# Patient Record
Sex: Female | Born: 1980 | Race: White | Hispanic: No | Smoking: Current every day smoker
Health system: Southern US, Community
[De-identification: ages and names within clinical notes are randomized; demographics above are authoritative.]

## PROBLEM LIST (undated history)

## (undated) DIAGNOSIS — G4730 Sleep apnea, unspecified: Secondary | ICD-10-CM

## (undated) DIAGNOSIS — F419 Anxiety disorder, unspecified: Secondary | ICD-10-CM

## (undated) DIAGNOSIS — M1990 Unspecified osteoarthritis, unspecified site: Secondary | ICD-10-CM

## (undated) HISTORY — DX: Anxiety disorder, unspecified: F41.9

## (undated) HISTORY — DX: Unspecified osteoarthritis, unspecified site: M19.90

## (undated) HISTORY — DX: Sleep apnea, unspecified: G47.30

---

## 2011-12-08 ENCOUNTER — Other Ambulatory Visit: Payer: Self-pay | Admitting: Psychiatry

## 2011-12-08 DIAGNOSIS — M961 Postlaminectomy syndrome, not elsewhere classified: Secondary | ICD-10-CM

## 2011-12-13 ENCOUNTER — Other Ambulatory Visit: Payer: Self-pay

## 2011-12-14 ENCOUNTER — Ambulatory Visit
Admission: RE | Admit: 2011-12-14 | Discharge: 2011-12-14 | Disposition: A | Discharge status: Home / Self Care | Payer: Managed Care, Other (non HMO) | Source: Ambulatory Visit | Attending: Psychiatry | Admitting: Psychiatry

## 2011-12-14 DIAGNOSIS — M961 Postlaminectomy syndrome, not elsewhere classified: Secondary | ICD-10-CM

## 2011-12-14 MED ORDER — GADOBENATE DIMEGLUMINE 529 MG/ML IV SOLN
15.0000 mL | Freq: Once | INTRAVENOUS | Status: AC | PRN
  Administered 2011-12-14: 15 mL via INTRAVENOUS

## 2011-12-25 ENCOUNTER — Other Ambulatory Visit: Payer: Self-pay | Admitting: Psychiatry

## 2011-12-25 ENCOUNTER — Ambulatory Visit
Admission: RE | Admit: 2011-12-25 | Discharge: 2011-12-25 | Disposition: A | Discharge status: Home / Self Care | Payer: Managed Care, Other (non HMO) | Source: Ambulatory Visit | Attending: Psychiatry | Admitting: Psychiatry

## 2011-12-25 DIAGNOSIS — IMO0001 Reserved for inherently not codable concepts without codable children: Secondary | ICD-10-CM

## 2011-12-25 DIAGNOSIS — M961 Postlaminectomy syndrome, not elsewhere classified: Secondary | ICD-10-CM

## 2012-11-27 IMAGING — CR DG CERVICAL SPINE WITH FLEX & EXTEND
7 series · 7 of 7 positions shown · non-contrast
Comparison: MR cervical spine of 12/14/2011

CLINICAL DATA: Neck pain

CERVICAL SPINE COMPLETE WITH FLEXION AND EXTENSION VIEWS

[view not recorded (1 of 7)]
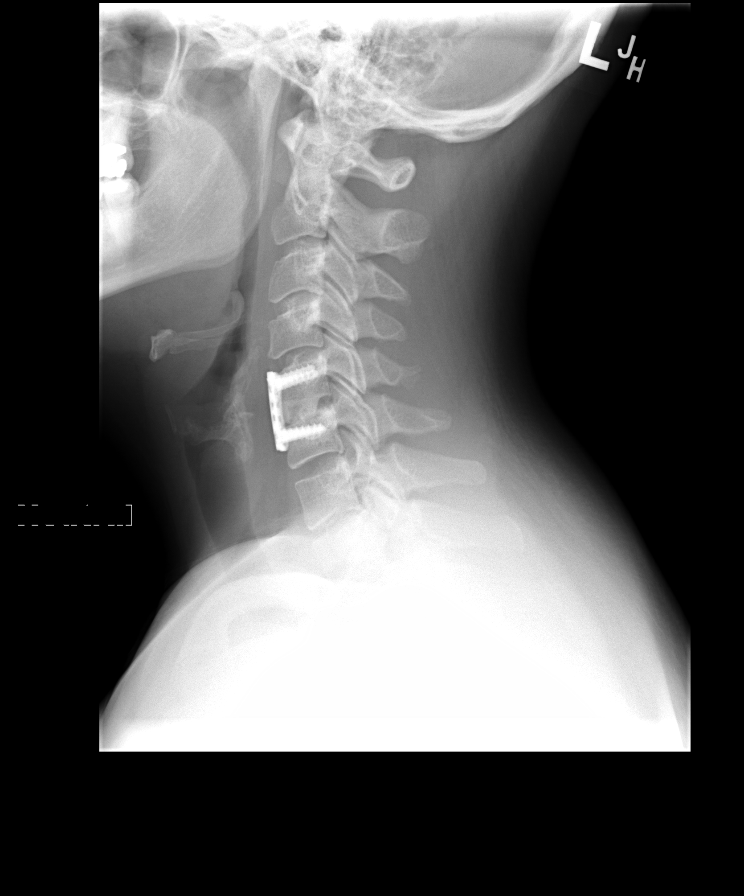

[view not recorded (2 of 7)]
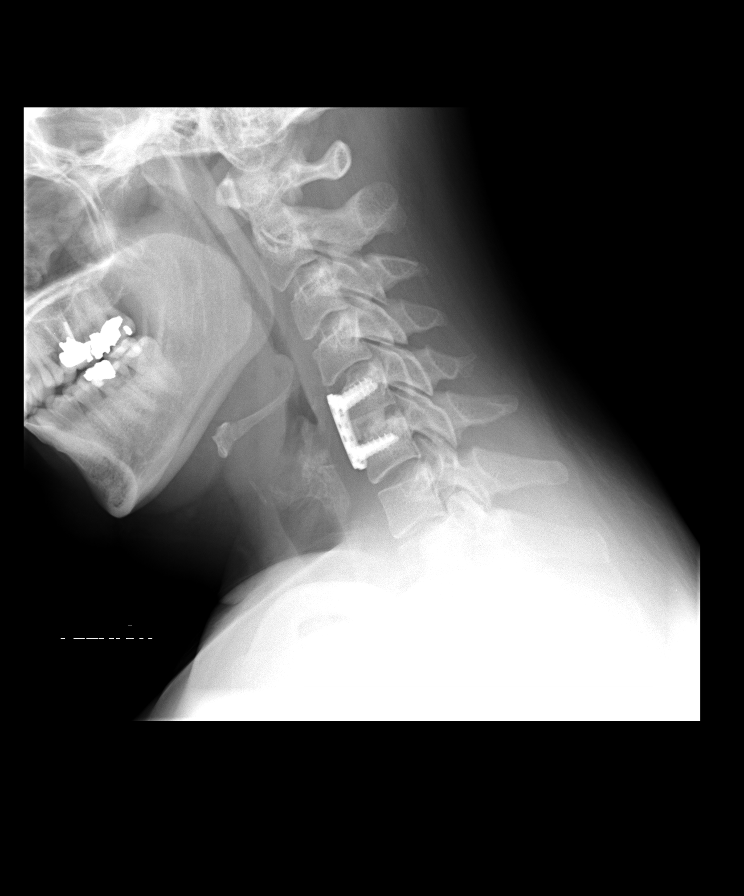

[view not recorded (3 of 7)]
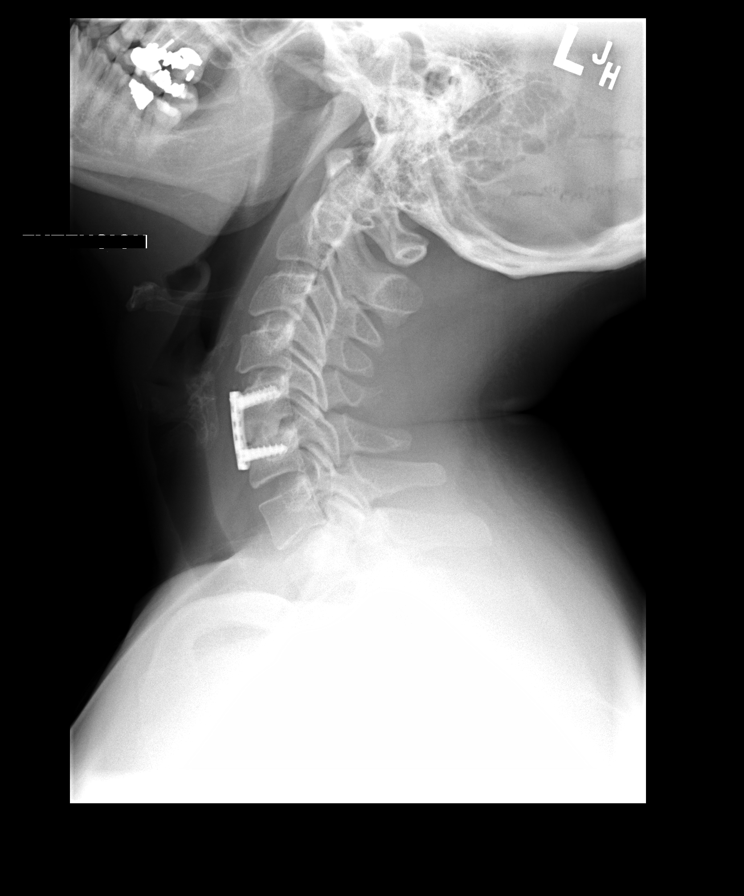

[view not recorded (4 of 7)]
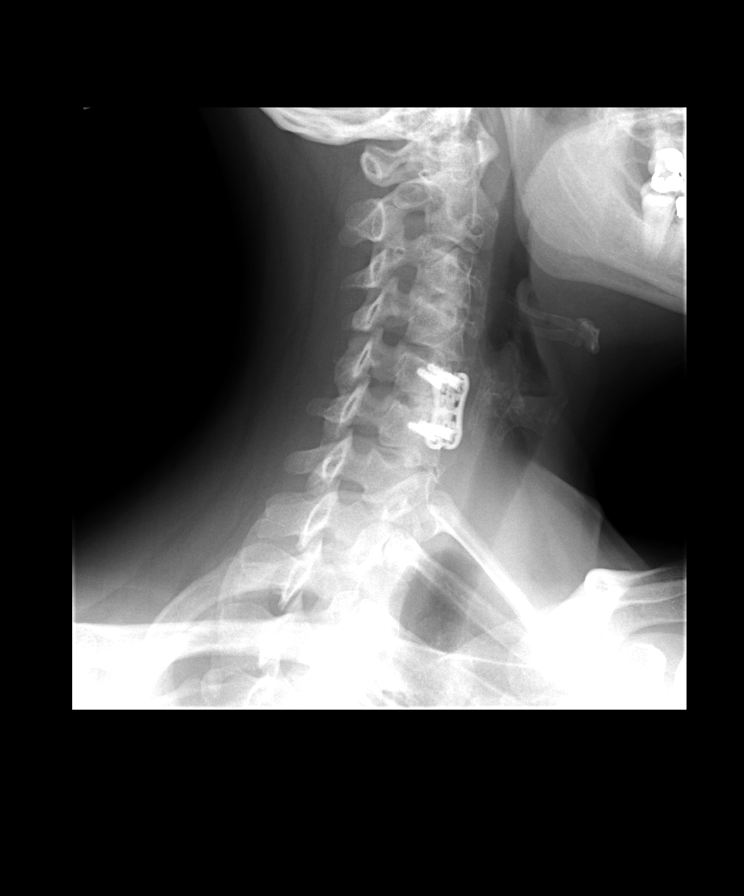

[view not recorded (5 of 7)]
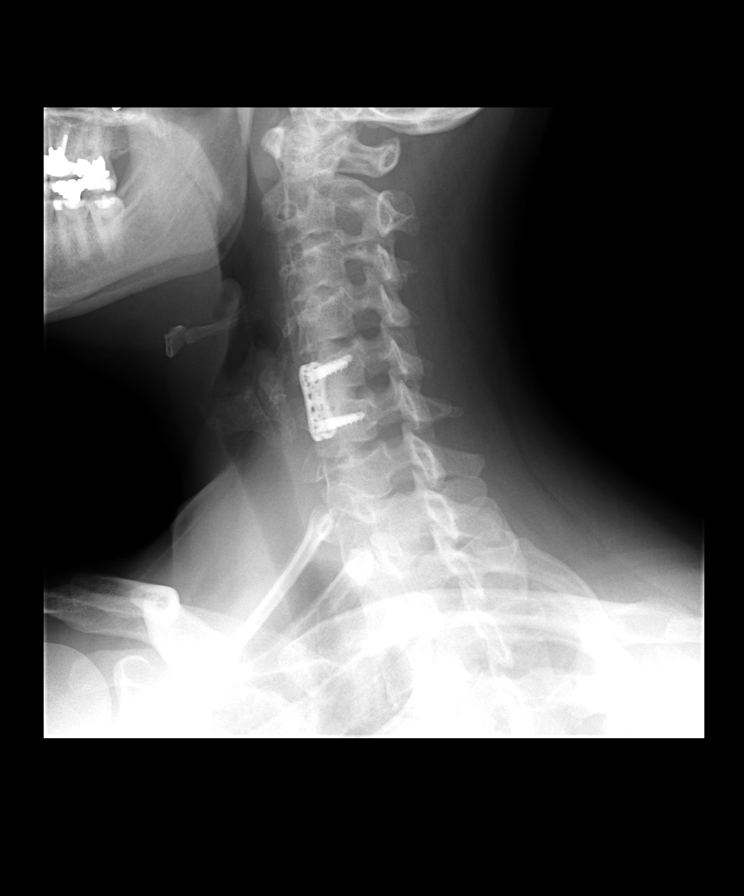

[view not recorded (6 of 7)]
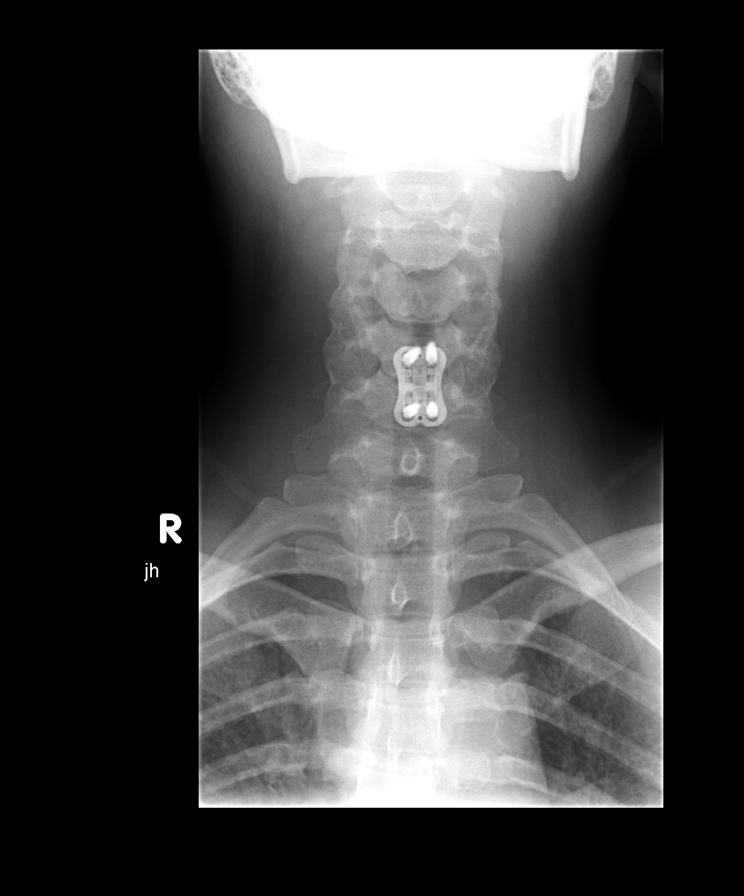

[view not recorded (7 of 7)]
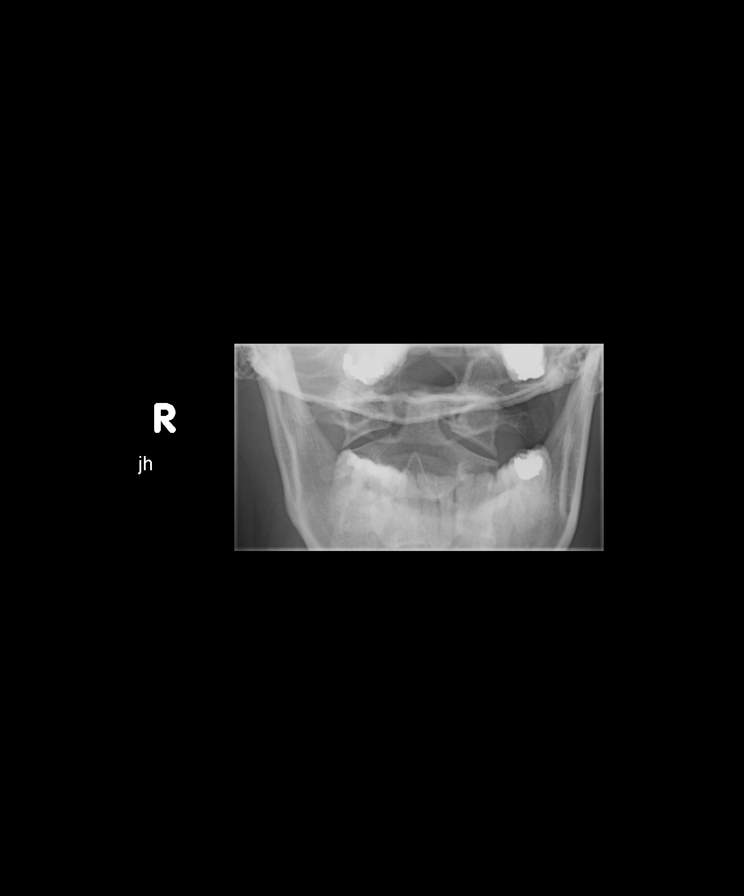

[7 of 7 positions shown; findings below may reference images not displayed]

FINDINGS: The cervical vertebrae are in normal alignment.  Anterior
fusion appears solid at the C5-6 level.  The remainder of
intervertebral disc spaces are within normal limits.  No
prevertebral soft tissue swelling is seen.  On oblique views the
foramina are patent.  The odontoid process is intact.  The lung
apices are clear.

Through flexion and extension there is relatively normal range of
motion with no malalignment.
IMPRESSION: 1.  Normal alignment.  Solid fusion at C5-6.
2.  Normal range of motion through flexion and extension.

## 2013-07-17 DIAGNOSIS — M797 Fibromyalgia: Secondary | ICD-10-CM | POA: Insufficient documentation

## 2014-01-15 DIAGNOSIS — M5481 Occipital neuralgia: Secondary | ICD-10-CM | POA: Insufficient documentation

## 2017-08-10 DIAGNOSIS — G894 Chronic pain syndrome: Secondary | ICD-10-CM | POA: Insufficient documentation

## 2019-03-24 HISTORY — PX: OTHER SURGICAL HISTORY: SHX169

## 2020-05-20 LAB — HM PAP SMEAR

## 2020-05-20 LAB — RESULTS CONSOLE HPV: CHL HPV: NEGATIVE

## 2020-05-21 LAB — HM MAMMOGRAPHY

## 2021-04-15 DIAGNOSIS — Z981 Arthrodesis status: Secondary | ICD-10-CM | POA: Diagnosis not present

## 2021-04-15 DIAGNOSIS — M7918 Myalgia, other site: Secondary | ICD-10-CM | POA: Diagnosis not present

## 2021-04-15 DIAGNOSIS — M4322 Fusion of spine, cervical region: Secondary | ICD-10-CM | POA: Diagnosis not present

## 2021-04-15 DIAGNOSIS — M961 Postlaminectomy syndrome, not elsewhere classified: Secondary | ICD-10-CM | POA: Diagnosis not present

## 2021-04-15 DIAGNOSIS — G894 Chronic pain syndrome: Secondary | ICD-10-CM | POA: Diagnosis not present

## 2021-04-15 DIAGNOSIS — M47812 Spondylosis without myelopathy or radiculopathy, cervical region: Secondary | ICD-10-CM | POA: Diagnosis not present

## 2021-04-15 DIAGNOSIS — Z79899 Other long term (current) drug therapy: Secondary | ICD-10-CM | POA: Diagnosis not present

## 2021-04-15 DIAGNOSIS — Z9889 Other specified postprocedural states: Secondary | ICD-10-CM | POA: Diagnosis not present

## 2021-07-15 DIAGNOSIS — M961 Postlaminectomy syndrome, not elsewhere classified: Secondary | ICD-10-CM | POA: Diagnosis not present

## 2021-07-15 DIAGNOSIS — M7918 Myalgia, other site: Secondary | ICD-10-CM | POA: Diagnosis not present

## 2021-07-15 DIAGNOSIS — G894 Chronic pain syndrome: Secondary | ICD-10-CM | POA: Diagnosis not present

## 2021-10-06 DIAGNOSIS — G894 Chronic pain syndrome: Secondary | ICD-10-CM | POA: Diagnosis not present

## 2021-10-06 DIAGNOSIS — Z79899 Other long term (current) drug therapy: Secondary | ICD-10-CM | POA: Diagnosis not present

## 2021-10-06 DIAGNOSIS — M961 Postlaminectomy syndrome, not elsewhere classified: Secondary | ICD-10-CM | POA: Diagnosis not present

## 2021-10-31 DIAGNOSIS — R059 Cough, unspecified: Secondary | ICD-10-CM | POA: Diagnosis not present

## 2021-10-31 DIAGNOSIS — B9689 Other specified bacterial agents as the cause of diseases classified elsewhere: Secondary | ICD-10-CM | POA: Diagnosis not present

## 2021-10-31 DIAGNOSIS — J208 Acute bronchitis due to other specified organisms: Secondary | ICD-10-CM | POA: Diagnosis not present

## 2021-10-31 DIAGNOSIS — R062 Wheezing: Secondary | ICD-10-CM | POA: Diagnosis not present

## 2022-01-05 DIAGNOSIS — G894 Chronic pain syndrome: Secondary | ICD-10-CM | POA: Diagnosis not present

## 2022-01-05 DIAGNOSIS — M961 Postlaminectomy syndrome, not elsewhere classified: Secondary | ICD-10-CM | POA: Diagnosis not present

## 2022-01-05 DIAGNOSIS — M47816 Spondylosis without myelopathy or radiculopathy, lumbar region: Secondary | ICD-10-CM | POA: Diagnosis not present

## 2022-02-09 ENCOUNTER — Encounter: Payer: Self-pay | Admitting: Family Medicine

## 2022-02-09 ENCOUNTER — Ambulatory Visit: Payer: BC Managed Care – PPO | Admitting: Family Medicine

## 2022-02-09 VITALS — BP 108/70 | HR 76 | Temp 98.3°F | Resp 18 | Ht 64.0 in | Wt 171.4 lb

## 2022-02-09 DIAGNOSIS — E782 Mixed hyperlipidemia: Secondary | ICD-10-CM | POA: Diagnosis not present

## 2022-02-09 DIAGNOSIS — R5383 Other fatigue: Secondary | ICD-10-CM | POA: Diagnosis not present

## 2022-02-09 DIAGNOSIS — G894 Chronic pain syndrome: Secondary | ICD-10-CM

## 2022-02-09 DIAGNOSIS — Z7689 Persons encountering health services in other specified circumstances: Secondary | ICD-10-CM

## 2022-02-09 LAB — COMPREHENSIVE METABOLIC PANEL
ALT: 13 U/L (ref 0–35)
AST: 15 U/L (ref 0–37)
Albumin: 4.6 g/dL (ref 3.5–5.2)
Alkaline Phosphatase: 41 U/L (ref 39–117)
BUN: 11 mg/dL (ref 6–23)
CO2: 28 mEq/L (ref 19–32)
Calcium: 9.4 mg/dL (ref 8.4–10.5)
Chloride: 103 mEq/L (ref 96–112)
Creatinine, Ser: 0.73 mg/dL (ref 0.40–1.20)
GFR: 101.36 mL/min (ref >60.00)
Glucose, Bld: 86 mg/dL (ref 70–99)
Potassium: 4.3 mEq/L (ref 3.5–5.1)
Sodium: 138 mEq/L (ref 135–145)
Total Bilirubin: 0.5 mg/dL (ref 0.2–1.2)
Total Protein: 7.1 g/dL (ref 6.0–8.3)

## 2022-02-09 LAB — LIPID PANEL
Cholesterol: 240 mg/dL — ABNORMAL HIGH (ref 0–200)
HDL: 47.5 mg/dL (ref >39.00)
LDL Cholesterol: 174 mg/dL — ABNORMAL HIGH (ref 0–99)
NonHDL: 192.27
Total CHOL/HDL Ratio: 5
Triglycerides: 90 mg/dL (ref 0.0–149.0)
VLDL: 18 mg/dL (ref 0.0–40.0)

## 2022-02-09 LAB — CBC
HCT: 40.6 % (ref 36.0–46.0)
Hemoglobin: 14.3 g/dL (ref 12.0–15.0)
MCHC: 35.1 g/dL (ref 30.0–36.0)
MCV: 85.2 fl (ref 78.0–100.0)
Platelets: 197 10*3/uL (ref 150.0–400.0)
RBC: 4.77 Mil/uL (ref 3.87–5.11)
RDW: 12.6 % (ref 11.5–15.5)
WBC: 9.4 10*3/uL (ref 4.0–10.5)

## 2022-02-09 LAB — VITAMIN B12: Vitamin B-12: 263 pg/mL (ref 211–911)

## 2022-02-09 LAB — TSH: TSH: 0.89 u[IU]/mL (ref 0.35–5.50)

## 2022-02-09 NOTE — Assessment & Plan Note (Signed)
At baseline Medication management by [facility]) Consider PT referral for soft tissue modalities and dry needling - she will think about it and let me know

## 2022-02-09 NOTE — Assessment & Plan Note (Signed)
Elevated on previous labs, repeat today Lifestyle factors for lowering cholesterol include: Diet therapy - heart-healthy diet rich in fruits, veggies, fiber-rich whole grains, lean meats, chicken, fish (at least twice a week), fat-free or 1% dairy products; foods low in saturated/trans fats, cholesterol, sodium, and sugar. Mediterranean diet has shown to be very heart healthy. Regular exercise - recommend at least 30 minutes a day, 5 times per week Weight management

## 2022-02-09 NOTE — Progress Notes (Signed)
New Patient Office Visit  Subjective    Patient ID: Tanya Gallegos, female    DOB: 06/12/1980  Age: 41 y.o. MRN: [id number]  CC:  Chief Complaint  Patient presents with   New Patient (Initial Visit)    HPI Tanya Gallegos presents to establish care  For the past year or so she has noticed some new food sensitivies and digestive issues. Reports she feels more gassy and bloated than usual, but no abdominal pain (unless she tries to prevent herself from passing gas for too long), no stool changes, no nausea, vomiting, diarrhea, constipation, blood in stool. She thinks chicken may be a trigger, but otherwise unsure.   She also reports always feeling tired/fatigued. She feels like she could take a nap everyday. Her daughter reports that she snores loudly and has noticed some periods of apnea during her sleep. She has never had a sleep study before, but is interested.   She has a history of back/neck surgeries leading to a lot of restrictions. She is stable as long as she is on chronic pain meds and does not overdo it with activity. She gets good relief from massage therapy, but it becomes expensive. She has done PT in the past and has a pool at home for aqua therapy. She follows with [facility] pain management clinic every 3 months.   She follows with GYN for routine care. Pap and mammo were normal last year (records requested). Mirena IUD for contraception. Not currently sexually active.     Outpatient Encounter Medications as of 02/09/2022  Medication Sig   buprenorphine (SUBUTEX) 2 MG SUBL SL tablet Place 2 mg under the tongue daily.   HYDROcodone-acetaminophen (NORCO) 10-325 MG tablet Take 1 tablet by mouth every 6 (six) hours as needed.   levonorgestrel (MIRENA) 20 MCG/DAY IUD by Intrauterine route.   pregabalin (LYRICA) 150 MG capsule Take 1 capsule by mouth 2 (two) times daily.   zolpidem (AMBIEN) 10 MG tablet Take 10 mg by mouth at bedtime as needed for sleep.   No  facility-administered encounter medications on file as of 02/09/2022.    Past Medical History:  Diagnosis Date   Anxiety    Arthritis    Sleep apnea     Past Surgical History:  Procedure Laterality Date   neck fusion  2021    History reviewed. No pertinent family history.  Social History   Socioeconomic History   Marital status: Divorced    Spouse name: Not on file   Number of children: Not on file   Years of education: Not on file   Highest education level: Not on file  Occupational History   Not on file  Tobacco Use   Smoking status: Every Day    Types: Cigarettes   Smokeless tobacco: Never  Substance and Sexual Activity   Alcohol use: Yes   Drug use: Never   Sexual activity: Not Currently  Other Topics Concern   Not on file  Social History Narrative   Not on file   Social Determinants of Health   Financial Resource Strain: Not on file  Food Insecurity: Not on file  Transportation Needs: Not on file  Physical Activity: Not on file  Stress: Not on file  Social Connections: Not on file  Intimate Partner Violence: Not on file    ROS All review of systems negative except what is listed in the HPI      Objective    BP 108/70 (BP Location: Left Arm, Patient Position:  Sitting, Cuff Size: Normal)   Pulse 76   Temp 98.3 F (36.8 C) (Oral)   Resp 18   Ht 5\' 4"  (1.626 m)   Wt 171 lb 6.4 oz (77.7 kg)   SpO2 96%   BMI 29.42 kg/m   Physical Exam Vitals reviewed.  Constitutional:      General: She is not in acute distress.    Appearance: Normal appearance. She is not ill-appearing.  Cardiovascular:     Rate and Rhythm: Normal rate and regular rhythm.  Pulmonary:     Effort: Pulmonary effort is normal.     Breath sounds: Normal breath sounds.  Musculoskeletal:     Cervical back: Normal range of motion and neck supple. No tenderness.  Lymphadenopathy:     Cervical: No cervical adenopathy.  Skin:    General: Skin is warm and dry.  Neurological:      General: No focal deficit present.     Mental Status: She is alert and oriented to person, place, and time. Mental status is at baseline.  Psychiatric:        Mood and Affect: Mood normal.        Behavior: Behavior normal.        Thought Content: Thought content normal.        Judgment: Judgment normal.         Assessment & Plan:   Problem List Items Addressed This Visit       Other   Chronic pain syndrome    At baseline Medication management by [facility]) Consider PT referral for soft tissue modalities and dry needling - she will think about it and let me know      Relevant Medications   buprenorphine (SUBUTEX) 2 MG SUBL SL tablet   pregabalin (LYRICA) 150 MG capsule   HYDROcodone-acetaminophen (NORCO) 10-325 MG tablet   Mixed hyperlipidemia    Elevated on previous labs, repeat today Lifestyle factors for lowering cholesterol include: Diet therapy - heart-healthy diet rich in fruits, veggies, fiber-rich whole grains, lean meats, chicken, fish (at least twice a week), fat-free or 1% dairy products; foods low in saturated/trans fats, cholesterol, sodium, and sugar. Mediterranean diet has shown to be very heart healthy. Regular exercise - recommend at least 30 minutes a day, 5 times per week Weight management        Relevant Orders   Comprehensive metabolic panel   Lipid panel   Other Visit Diagnoses     Fatigue, unspecified type    -  Primary Labs today Symptoms concerning for OSA - refer to pulmonology for potential home sleep study     Relevant Orders   CBC   Comprehensive metabolic panel   TSH   Ambulatory referral to Pulmonology   B12   Encounter to establish care           Return in about 1 year (around 02/10/2023) for CPE; f/u pending labs .   02/12/2023, NP

## 2022-02-09 NOTE — Patient Instructions (Addendum)
Thank you for choosing [facility] for your Primary Care needs. I am excited for the opportunity to partner with you to meet your health care goals. It was a pleasure meeting you today!   Recommend starting a food diary and monitoring intake and symptoms to find other potential triggers. Avoid possible triggers. If not improving with avoidance, we can refer to GI.   Information on diet, exercise, and health maintenance recommendations are listed below. This is information to help you be sure you are on track for optimal health and monitoring.   Please look over this and let us know if you have any questions or if you have completed any of the health maintenance outside of [facility] so that we can be sure your records are up to date.  ___________________________________________________________  MyChart:  For all urgent or time sensitive needs we ask that you please call the office to avoid delays. Our number is [phone]. MyChart is not constantly monitored and due to the large volume of messages a day, replies may take up to 72 business hours.  MyChart Policy: MyChart allows for you to see your visit notes, after visit summary, provider recommendations, lab and tests results, make an appointment, request refills, and contact your provider or the office for non-urgent questions or concerns. Providers are seeing patients during normal business hours and do not have built in time to review MyChart messages.  We ask that you allow a minimum of 3 business days for responses to [employer]. For this reason, please do not send urgent requests through MyChart. Please call the office at [phone]. New and ongoing conditions may require a visit. We have virtual and in-person visits available for your convenience.  Complex MyChart concerns may require a visit. Your provider may request you schedule a virtual or in-person visit to ensure we are providing the best  care possible. MyChart messages sent after 11:00 AM on Friday will not be received by the provider until Monday morning.    Lab and Test Results: You will receive your lab and test results on MyChart as soon as they are completed and results have been sent by the lab or testing facility. Due to this service, you will receive your results BEFORE your provider.  I review lab and test results each morning prior to seeing patients. Some results require collaboration with other providers to ensure you are receiving the most appropriate care. For this reason, we ask that you please allow a minimum of 3-5 business days from the time that ALL results have been received for your provider to receive and review lab and test results and contact you about these.  Most lab and test result comments from the provider will be sent through MyChart. Your provider may recommend changes to the plan of care, follow-up visits, repeat testing, ask questions, or request an office visit to discuss these results. You may reply directly to this message or call the office to provide information for the provider or set up an appointment. In some instances, you will be called with test results and recommendations. Please let us know if this is preferred and we will make note of this in your chart to provide this for you.    If you have not heard a response to your lab or test results in 5 business days from all results returning to MyChart, please call the office to let us know. We ask that you please avoid calling  prior to this time unless there is an emergent concern. Due to high call volumes, this can delay the resulting process.  After Hours: For all non-emergency after hours needs, please call the office at [phone] and select the option to reach the on-call  service. On-call services are shared between multiple [facility] offices and therefore it will not be possible to speak directly with your provider. On-call providers  may provide medical advice and recommendations, but are unable to provide refills for maintenance medications.  For all emergency or urgent medical needs after normal business hours, we recommend that you seek care at the closest Urgent Care or Emergency Department to ensure appropriate treatment in a timely manner.  [facility] has a 24 hour emergency room located on the ground floor for your convenience.   Urgent Concerns During the Business Day Providers are seeing patients from 8AM to 5PM with a busy schedule and are most often not able to respond to non-urgent calls until the end of the day or the next business day. If you should have URGENT concerns during the day, please call and speak to the nurse or schedule a same day appointment so that we can address your concern without delay.   Thank you, again, for choosing me as your health care partner. I appreciate your trust and look forward to learning more about you.   Lollie Marrowaylor B. Reola CalkinsBeck, DNP, FNP-C  ___________________________________________________________  Health Maintenance Recommendations Screening Testing Mammogram Every 1-2 years based on history and risk factors Starting at age 42 Pap Smear Ages 21-39 every 3 years Ages 4130-65 every 5 years with HPV testing More frequent testing may be required based on results and history Colon Cancer Screening Every 1-10 years based on test performed, risk factors, and history Starting at age 42 Bone Density Screening Every 2-10 years based on history Starting at age 42 for women Recommendations for men differ based on medication usage, history, and risk factors AAA Screening One time ultrasound Men 9765-42 years old who have ever smoked Lung Cancer Screening Low Dose Lung CT every 12 months Age 60-80 years with a 20 pack-year smoking history who still smoke or who have quit within the last 15 years  Screening Labs Routine  Labs: Complete Blood Count (CBC),  Complete Metabolic Panel (CMP), Cholesterol (Lipid Panel) Every 6-12 months based on history and medications May be recommended more frequently based on current conditions or previous results Hemoglobin A1c Lab Every 3-12 months based on history and previous results Starting at age 42 or earlier with diagnosis of diabetes, high cholesterol, BMI >26, and/or risk factors Frequent monitoring for patients with diabetes to ensure blood sugar control Thyroid Panel (TSH w/ T3 & T4) Every 6 months based on history, symptoms, and risk factors May be repeated more often if on medication HIV One time testing for all patients 3913 and older May be repeated more frequently for patients with increased risk factors or exposure Hepatitis C One time testing for all patients 1918 and older May be repeated more frequently for patients with increased risk factors or exposure Gonorrhea, Chlamydia Every 12 months for all sexually active persons 13-24 years Additional monitoring may be recommended for those who are considered high risk or who have symptoms PSA Men 4640-679 years old with risk factors Additional screening may be recommended from age 42-69 based on risk factors, symptoms, and history  Vaccine Recommendations Tetanus Booster All adults every 10 years Flu Vaccine All patients 6 months and older  every year COVID Vaccine All patients 12 years and older Initial dosing with booster May recommend additional booster based on age and health history HPV Vaccine 2 doses all patients age 87-26 Dosing may be considered for patients over 26 Shingles Vaccine (Shingrix) 2 doses all adults 50 years and older Pneumonia (Pneumovax 23) All adults 65 years and older May recommend earlier dosing based on health history Pneumonia (Prevnar 39) All adults 65 years and older Dosed 1 year after Pneumovax 23 Pneumonia (Prevnar 20) All adults 65 years and older (adults 19-64 with certain conditions or risk  factors) 1 dose  For those who have no received Prevnar 13 vaccine previously   Additional Screening, Testing, and Vaccinations may be recommended on an individualized basis based on family history, health history, risk factors, and/or exposure.  __________________________________________________________  Diet Recommendations for All Patients  I recommend that all patients maintain a diet low in saturated fats, carbohydrates, and cholesterol. While this can be challenging at first, it is not impossible and small changes can make big differences.  Things to try: Decreasing the amount of soda, sweet tea, and/or juice to one or less per day and replace with water While water is always the first choice, if you do not like water you may consider adding a water additive without sugar to improve the taste other sugar free drinks Replace potatoes with a brightly colored vegetable at dinner Use healthy oils, such as canola oil or olive oil, instead of butter or hard margarine Limit your bread intake to two pieces or less a day Replace regular pasta with low carb pasta options Bake, broil, or grill foods instead of frying Monitor portion sizes  Eat smaller, more frequent meals throughout the day instead of large meals  An important thing to remember is, if you love foods that are not great for your health, you don't have to give them up completely. Instead, allow these foods to be a reward when you have done well. Allowing yourself to still have special treats every once in a while is a nice way to tell yourself thank you for working hard to keep yourself healthy.   Also remember that every day is a new day. If you have a bad day and "fall off the wagon", you can still climb right back up and keep moving along on your journey!  We have resources available to help you!  Some websites that may be helpful  include: www.http://www.wall-moore.info/  Www.VeryWellFit.com _____________________________________________________________  Activity Recommendations for All Patients  I recommend that all adults get at least 20 minutes of moderate physical activity that elevates your heart rate at least 5 days out of the week.  Some examples include: Walking or jogging at a pace that allows you to carry on a conversation Cycling (stationary bike or outdoors) Water aerobics Yoga Weight lifting Dancing If physical limitations prevent you from putting stress on your joints, exercise in a pool or seated in a chair are excellent options.  Do determine your MAXIMUM heart rate for activity: 220 - YOUR AGE = MAX Heart Rate   Remember! Do not push yourself too hard.  Start slowly and build up your pace, speed, weight, time in exercise, etc.  Allow your body to rest between exercise and get good sleep. You will need more water than normal when you are exerting yourself. Do not wait until you are thirsty to drink. Drink with a purpose of getting in at least 8, 8 ounce glasses of water a  day plus more depending on how much you exercise and sweat.    If you begin to develop dizziness, chest pain, abdominal pain, jaw pain, shortness of breath, headache, vision changes, lightheadedness, or other concerning symptoms, stop the activity and allow your body to rest. If your symptoms are severe, seek emergency evaluation immediately. If your symptoms are concerning, but not severe, please let us know so that we can recommend further evaluation.

## 2022-02-10 ENCOUNTER — Encounter: Payer: Self-pay | Admitting: Family Medicine

## 2022-03-02 ENCOUNTER — Ambulatory Visit: Payer: BC Managed Care – PPO | Admitting: Adult Health

## 2022-03-02 ENCOUNTER — Encounter: Payer: Self-pay | Admitting: Adult Health

## 2022-03-02 VITALS — BP 116/66 | HR 83 | Temp 98.0°F | Ht 63.0 in | Wt 173.2 lb

## 2022-03-02 DIAGNOSIS — G4733 Obstructive sleep apnea (adult) (pediatric): Secondary | ICD-10-CM

## 2022-03-02 DIAGNOSIS — R0683 Snoring: Secondary | ICD-10-CM

## 2022-03-02 NOTE — Progress Notes (Signed)
Reviewed and agree with assessment/plan.   Flois Mctague, MD [facility] 03/02/2022, 10:39 AM Pager:  [phone]  
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
## 2022-03-02 NOTE — Assessment & Plan Note (Addendum)
Snoring, restless sleep, daytime sleepiness all concerning for sleep apnea.-Will set patient up for home sleep study Patient education given on sleep apnea - discussed how weight can impact sleep and risk for sleep disordered breathing - discussed options to assist with weight loss: combination of diet modification, cardiovascular and strength training exercises   - had an extensive discussion regarding the adverse health consequences related to untreated sleep disordered breathing - specifically discussed the risks for hypertension, coronary artery disease, cardiac dysrhythmias, cerebrovascular disease, and diabetes - lifestyle modification discussed   - discussed how sleep disruption can increase risk of accidents, particularly when driving - safe driving practices were discussed   Plan  Patient Instructions  Set up home sleep study  Work on healthy weight loss.  Do not drive if sleepy  Healthy sleep regimen .  Follow up in 3 months to discuss results and treatment plan

## 2022-03-02 NOTE — Patient Instructions (Signed)
Set up home sleep study  Work on healthy weight loss.  Do not drive if sleepy  Healthy sleep regimen .  Follow up in 3 months to discuss results and treatment plan

## 2022-04-07 DIAGNOSIS — G894 Chronic pain syndrome: Secondary | ICD-10-CM | POA: Diagnosis not present

## 2022-04-07 DIAGNOSIS — Z79899 Other long term (current) drug therapy: Secondary | ICD-10-CM | POA: Diagnosis not present

## 2022-04-07 DIAGNOSIS — M961 Postlaminectomy syndrome, not elsewhere classified: Secondary | ICD-10-CM | POA: Diagnosis not present

## 2022-04-20 ENCOUNTER — Ambulatory Visit: Payer: BC Managed Care – PPO | Admitting: Family Medicine

## 2022-04-20 ENCOUNTER — Encounter: Payer: Self-pay | Admitting: Family Medicine

## 2022-04-20 VITALS — BP 111/70 | HR 80 | Temp 98.4°F | Resp 16 | Ht 63.0 in | Wt 170.0 lb

## 2022-04-20 DIAGNOSIS — J40 Bronchitis, not specified as acute or chronic: Secondary | ICD-10-CM

## 2022-04-20 MED ORDER — BENZONATATE 200 MG PO CAPS: 200.0000 mg | ORAL_CAPSULE | Freq: Two times a day (BID) | ORAL | 0 refills | Status: DC | PRN

## 2022-04-20 MED ORDER — METHYLPREDNISOLONE ACETATE 40 MG/ML IJ SUSP
40.0000 mg | Freq: Once | INTRAMUSCULAR | Status: AC
  Administered 2022-04-20: 40 mg via INTRAMUSCULAR

## 2022-04-20 MED ORDER — AZITHROMYCIN 250 MG PO TABS: ORAL_TABLET | ORAL | 0 refills | Status: AC

## 2022-04-20 MED ORDER — PREDNISONE 10 MG PO TABS: ORAL_TABLET | ORAL | 0 refills | Status: AC

## 2022-04-20 NOTE — Progress Notes (Signed)
Acute Office Visit  Subjective:     Patient ID: Tanya Gallegos, female    DOB: [birth year], 42 y.o.   MRN: [id number]  Chief Complaint  Patient presents with   Cough    Started Friday    Fatigue     Patient is in today for cough, fatigue, wheezing.   Patient is here for 4-5 days of cough, wheezing, fatigue, that has worsened over the past 1-2 days. She is now having purulent sputum, left sided sinus pressure, bilateral ear pressure, rhinorrhea, nasal congestion, fatigue, frequent wheezing and dyspnea. She has already had to use her albuterol 3 times today. She reports that this happens to her a couple times per year and she always needs a steroid injection followed by oral prednisone and Zpak. She refuses flu/COVID testing. She denies fevers, chills, headache, sore throat, chest pain.      All review of systems negative except what is listed in the HPI      Objective:    BP 111/70   Pulse 80   Temp 98.4 F (36.9 C)   Resp 16   Ht 5\' 3"  (1.6 m)   Wt 170 lb (77.1 kg)   SpO2 98%   BMI 30.11 kg/m    Physical Exam Vitals reviewed.  Constitutional:      General: She is not in acute distress.    Appearance: Normal appearance.  HENT:     Head: Normocephalic and atraumatic.     Right Ear: Tympanic membrane normal.     Left Ear: Tympanic membrane normal.     Nose: Congestion present.     Mouth/Throat:     Mouth: Mucous membranes are moist.     Pharynx: Oropharynx is clear.  Eyes:     Conjunctiva/sclera: Conjunctivae normal.  Cardiovascular:     Rate and Rhythm: Normal rate and regular rhythm.     Pulses: Normal pulses.     Heart sounds: Normal heart sounds.  Pulmonary:     Effort: Pulmonary effort is normal.     Breath sounds: Wheezing present. No rhonchi or rales.  Musculoskeletal:     Cervical back: Normal range of motion and neck supple.  Skin:    General: Skin is warm and dry.  Neurological:     Mental Status: She is alert and oriented to person,  place, and time.  Psychiatric:        Mood and Affect: Mood normal.        Behavior: Behavior normal.        Thought Content: Thought content normal.        Judgment: Judgment normal.      No results found for any visits on 04/20/22.      Assessment & Plan:   Problem List Items Addressed This Visit   None Visit Diagnoses     Bronchitis    -  Primary Steroid injection followed by short prednisone taper. Continue using your inhaler as needed.  Continue supportive measures including rest, hydration, humidifier use, steam showers, warm compresses to sinuses, warm liquids with lemon and honey, and over-the-counter cough, cold, and analgesics as needed.  Discussed antibiotic stewardship and viral vs bacterial infections. If not continuing to make progress over the next 2-3 days, or if symptoms worsen before then start the [place]. Patient aware of signs/symptoms requiring further/urgent evaluation.     Relevant Medications   methylPREDNISolone acetate (DEPO-MEDROL) injection 40 mg   predniSONE (DELTASONE) 10 MG tablet   azithromycin (ZITHROMAX) 250 MG  tablet   benzonatate (TESSALON) 200 MG capsule       Meds ordered this encounter  Medications   methylPREDNISolone acetate (DEPO-MEDROL) injection 40 mg   predniSONE (DELTASONE) 10 MG tablet    Sig: Take 5 tablets (50 mg total) by mouth daily with breakfast for 1 day, THEN 4 tablets (40 mg total) daily with breakfast for 1 day, THEN 3 tablets (30 mg total) daily with breakfast for 1 day, THEN 2 tablets (20 mg total) daily with breakfast for 1 day, THEN 1 tablet (10 mg total) daily with breakfast for 1 day.    Dispense:  15 tablet    Refill:  0    Order Specific Question:   Supervising Provider    Answer:   Penni Homans A [[id number]]   azithromycin (ZITHROMAX) 250 MG tablet    Sig: Take 2 tablets on day 1, then 1 tablet daily on days 2 through 5    Dispense:  6 tablet    Refill:  0    Order Specific Question:   Supervising Provider     Answer:   Penni Homans A [[id number]]   benzonatate (TESSALON) 200 MG capsule    Sig: Take 1 capsule (200 mg total) by mouth 2 (two) times daily as needed for cough.    Dispense:  20 capsule    Refill:  0    Order Specific Question:   Supervising Provider    Answer:   Penni Homans A [[id number]]    Return if symptoms worsen or fail to improve.  Terrilyn Saver, NP

## 2022-04-20 NOTE — Patient Instructions (Signed)
Steroid injection followed by short prednisone taper. Continue using your inhaler as needed.  Continue supportive measures including rest, hydration, humidifier use, steam showers, warm compresses to sinuses, warm liquids with lemon and honey, and over-the-counter cough, cold, and analgesics as needed.  If not continuing to make progress over the next 2-3 days, or if symptoms worsen before then start the [place].

## 2022-05-28 DIAGNOSIS — G4730 Sleep apnea, unspecified: Secondary | ICD-10-CM | POA: Diagnosis not present

## 2022-06-01 ENCOUNTER — Telehealth: Payer: Self-pay | Admitting: Adult Health

## 2022-06-01 NOTE — Telephone Encounter (Signed)
Patient states did the home sleep test last week. Mailed box back on Friday. Patient has appointment 06/02/2022. Patient would like to know if she needs to reschedule. Patient phone number is [phone].

## 2022-06-01 NOTE — Telephone Encounter (Signed)
Spoke with pt who states she did just turn in sleep test last Friday. Pt was rescheduled for OV with Tammy (07/02/22) to review results. Nothing further needed at this time

## 2022-06-02 ENCOUNTER — Ambulatory Visit: Payer: BC Managed Care – PPO | Admitting: Adult Health

## 2022-06-11 DIAGNOSIS — G4733 Obstructive sleep apnea (adult) (pediatric): Secondary | ICD-10-CM

## 2022-06-12 ENCOUNTER — Ambulatory Visit (INDEPENDENT_AMBULATORY_CARE_PROVIDER_SITE_OTHER): Payer: BC Managed Care – PPO

## 2022-06-12 DIAGNOSIS — G4733 Obstructive sleep apnea (adult) (pediatric): Secondary | ICD-10-CM

## 2022-07-02 ENCOUNTER — Ambulatory Visit: Payer: BC Managed Care – PPO | Admitting: Adult Health

## 2022-07-02 ENCOUNTER — Encounter: Payer: Self-pay | Admitting: Adult Health

## 2022-07-02 VITALS — BP 102/58 | HR 90 | Temp 98.4°F | Ht 63.0 in | Wt 167.2 lb

## 2022-07-02 DIAGNOSIS — G4733 Obstructive sleep apnea (adult) (pediatric): Secondary | ICD-10-CM

## 2022-07-02 NOTE — Patient Instructions (Signed)
Refer to Orthodontics, Dr. Toni Arthurs for oral appliance evaluation [phone]) Work on healthy weight loss.  Do not drive if sleepy  Healthy sleep regimen .  Use caution with sedating medications  Follow up in 6 months and As needed.

## 2022-07-02 NOTE — Progress Notes (Signed)
@Patient  ID: Tanya Gallegos, female    DOB: Jun 05, 1980, 42 y.o.   MRN: [id number]  No chief complaint on file.   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  Patient was seen in December 2023 for snoring and daytime sleepiness.  Set up for a home sleep study that was completed March 2024 that showed moderate sleep apnea with AHI 23/hour and SpO2 low at 79%, mean O2 saturation 90%.  We discussed his sleep study results went over treatment options including weight loss, oral appliance and CPAP therapy.  Patient like to proceed with oral appliance.   Allergies  Allergen Reactions   Nickel Hives   Oxycodone Hives   Tizanidine Other (See Comments)    Patient states it caused her to have nightmares   Tramadol Hives    Immunization History  Administered Date(s) Administered   Influenza,inj,Quad PF,6+ Mos 01/10/2018   Influenza-Unspecified 01/06/2017   Tdap 06/22/2010    Past Medical History:  Diagnosis Date   Anxiety    Arthritis    Sleep apnea     Tobacco History: Social History   Tobacco Use  Smoking Status Every Day   Types: Cigarettes  Smokeless Tobacco Never   Ready to quit: Not Answered Counseling given: Not Answered   Outpatient Medications Prior to Visit  Medication Sig Dispense Refill   benzonatate (TESSALON) 200 MG capsule Take 1 capsule (200 mg total) by mouth 2 (two) times daily as needed for cough. 20 capsule 0   buprenorphine (SUBUTEX) 2 MG SUBL SL tablet Place 2 mg under the tongue daily.     HYDROcodone-acetaminophen (NORCO) 10-325 MG tablet Take 1 tablet by mouth every 6 (six) hours as needed.      levonorgestrel (MIRENA) 20 MCG/DAY IUD by Intrauterine route.     pregabalin (LYRICA) 150 MG capsule Take 1 capsule by mouth 2 (two) times daily.     zolpidem (AMBIEN) 10 MG tablet Take 10 mg by mouth at bedtime as needed for sleep.     No facility-administered medications prior to visit.     Review of Systems:   Constitutional:   No  weight loss, night sweats,  Fevers, chills, +_ fatigue, or  lassitude.  HEENT:   No headaches,  Difficulty swallowing,  Tooth/dental problems, or  Sore throat,                No sneezing, itching, ear ache, nasal congestion, post nasal drip,   CV:  No chest pain,  Orthopnea, PND, swelling in lower extremities, anasarca, dizziness, palpitations, syncope.   GI  No heartburn, indigestion, abdominal pain, nausea, vomiting, diarrhea, change in bowel habits, loss of appetite, bloody stools.   Resp: No shortness of breath with exertion or at rest.  No excess mucus, no productive cough,  No non-productive cough,  No coughing up of blood.  No change in color of mucus.  No wheezing.  No chest wall deformity  Skin: no rash or lesions.  GU: no dysuria, change in color of urine, no urgency or frequency.  No flank pain, no hematuria   MS:  No joint pain or swelling.  No decreased range  of motion.  No back pain.    Physical Exam  GEN: A/Ox3; pleasant , NAD, well nourished    HEENT:  [place]/AT,  NOSE-clear, THROAT-clear, no lesions, no postnasal drip or exudate noted. Class 2-3 MP airway   NECK:  Supple w/ fair ROM; no JVD; normal carotid impulses w/o bruits; no thyromegaly or nodules palpated; no lymphadenopathy.    RESP  Clear  P & A; w/o, wheezes/ rales/ or rhonchi. no accessory muscle use, no dullness to percussion  CARD:  RRR, no m/r/g, no peripheral edema, pulses intact, no cyanosis or clubbing.  GI:   Soft & nt; nml bowel sounds; no organomegaly or masses detected.   Musco: Warm bil, no deformities or joint swelling noted.   Neuro: alert, no focal  deficits noted.    Skin: Warm, no lesions or rashes    Lab Results:    BNP No results found for: "BNP"  ProBNP No results found for: "PROBNP"  Imaging: No results found.        No data to display          No results found for: "NITRICOXIDE"      Assessment & Plan:   No problem-specific Assessment & Plan notes found for this encounter.     Rubye Oaksammy Deatra Mcmahen, NP 07/02/2022

## 2022-07-03 DIAGNOSIS — G4733 Obstructive sleep apnea (adult) (pediatric): Secondary | ICD-10-CM | POA: Insufficient documentation

## 2022-07-03 NOTE — Assessment & Plan Note (Signed)
Moderate obstructive sleep apnea-we discussed his sleep study results.  Went over treatment options including weight loss, oral appliance and CPAP.  Patient would like to hold off on CPAP at this time.  Would like to try oral appliance and see if this will be effective   Plan  Patient Instructions  Refer to Orthodontics, Dr. Toni Arthurs for oral appliance evaluation [phone]) Work on healthy weight loss.  Do not drive if sleepy  Healthy sleep regimen .  Use caution with sedating medications  Follow up in 6 months and As needed.

## 2022-07-03 NOTE — Progress Notes (Signed)
Reviewed and agree with assessment/plan.   Coralyn Helling, MD [facility] Pulmonary/Critical Care 07/03/2022, 5:11 PM Pager:  [phone]

## 2022-07-07 DIAGNOSIS — G894 Chronic pain syndrome: Secondary | ICD-10-CM | POA: Diagnosis not present

## 2022-07-07 DIAGNOSIS — M961 Postlaminectomy syndrome, not elsewhere classified: Secondary | ICD-10-CM | POA: Diagnosis not present

## 2022-07-07 DIAGNOSIS — M7918 Myalgia, other site: Secondary | ICD-10-CM | POA: Diagnosis not present

## 2022-08-24 ENCOUNTER — Ambulatory Visit: Payer: BC Managed Care – PPO | Admitting: Family Medicine

## 2022-08-24 ENCOUNTER — Encounter: Payer: Self-pay | Admitting: Family Medicine

## 2022-08-24 VITALS — BP 123/73 | HR 80 | Temp 98.2°F | Ht 63.0 in | Wt 166.0 lb

## 2022-08-24 DIAGNOSIS — J40 Bronchitis, not specified as acute or chronic: Secondary | ICD-10-CM

## 2022-08-24 MED ORDER — BENZONATATE 200 MG PO CAPS: 200.0000 mg | ORAL_CAPSULE | Freq: Two times a day (BID) | ORAL | 1 refills | Status: DC | PRN

## 2022-08-24 MED ORDER — METHYLPREDNISOLONE ACETATE 40 MG/ML IJ SUSP
40.0000 mg | Freq: Once | INTRAMUSCULAR | Status: AC
  Administered 2022-08-24: 40 mg via INTRAMUSCULAR

## 2022-08-24 MED ORDER — AZITHROMYCIN 250 MG PO TABS: ORAL_TABLET | ORAL | 0 refills | Status: AC

## 2022-08-24 NOTE — Patient Instructions (Signed)
Steroid injection today to help you start feeling better Adding Zpak as this worked last time - often times bronchitis can be viral, so you may want to see how you feel in the next 2-3 days before starting the antibiotic in case it is not necessary.  Adding Tessalon pearls for cough.  Continue supportive measures including rest, hydration, humidifier use, steam showers, warm compresses to sinuses, warm liquids with lemon and honey, and over-the-counter cough, cold, and analgesics as needed.   Please contact office for follow-up if symptoms do not improve or worsen. Seek emergency care if symptoms become severe.

## 2022-08-24 NOTE — Progress Notes (Signed)
Acute Office Visit  Subjective:     Patient ID: Tanya Gallegos, female    DOB: 05/06/1980, 42 y.o.   MRN: [id number]  Chief Complaint  Patient presents with   Sinus Problem     Upper Respiratory Infection: Patient complains of symptoms of a URI. Symptoms include  sore throat initially, fatigue, malaise, ear pressure, postnasal drainage, sinus pressure, leading to cough (green sputum), wheezing, headache . Onset of symptoms was 6 days ago, gradually worsening since that time. She is drinking plenty of fluids. Evaluation to date: none. Treatment to date:  Sinus Max D OTC . Denies chest pain, dyspnea, fevers, chills. States she has been running herself ragged the past few weeks working on her daughter's graduation.   She is leaving tomorrow to go to [facility] for her daughter's college orientation and wants to be feeling better.     ROS All review of systems negative except what is listed in the HPI      Objective:    BP 123/73   Pulse 80   Temp 98.2 F (36.8 C) (Oral)   Ht 5\' 3"  (1.6 m)   Wt 166 lb (75.3 kg)   SpO2 98%   BMI 29.41 kg/m    Physical Exam Vitals reviewed.  Constitutional:      Appearance: Normal appearance.  HENT:     Head: Normocephalic and atraumatic.     Right Ear: Tympanic membrane normal.     Left Ear: Tympanic membrane normal.  Cardiovascular:     Rate and Rhythm: Normal rate and regular rhythm.     Pulses: Normal pulses.     Heart sounds: Normal heart sounds.  Pulmonary:     Effort: Pulmonary effort is normal.     Breath sounds: Wheezing present.  Musculoskeletal:     Cervical back: Normal range of motion and neck supple. No tenderness.  Lymphadenopathy:     Cervical: No cervical adenopathy.  Skin:    General: Skin is warm and dry.  Neurological:     Mental Status: She is alert and oriented to person, place, and time.  Psychiatric:        Mood and Affect: Mood normal.        Behavior: Behavior normal.        Thought Content: Thought  content normal.        Judgment: Judgment normal.        No results found for any visits on 08/24/22.      Assessment & Plan:   Problem List Items Addressed This Visit   None Visit Diagnoses     Bronchitis    -  Primary   Relevant Medications   benzonatate (TESSALON) 200 MG capsule   azithromycin (ZITHROMAX) 250 MG tablet   methylPREDNISolone acetate (DEPO-MEDROL) injection 40 mg (Completed)     Steroid injection today to help you start feeling better Adding Zpak as this worked last time - often times bronchitis can be viral, so you may want to see how you feel in the next 2-3 days before starting the antibiotic in case it is not necessary.  Adding Tessalon pearls for cough.  Continue supportive measures including rest, hydration, humidifier use, steam showers, warm compresses to sinuses, warm liquids with lemon and honey, and over-the-counter cough, cold, and analgesics as needed.   Please contact office for follow-up if symptoms do not improve or worsen. Seek emergency care if symptoms become severe.  Meds ordered this encounter  Medications   benzonatate (TESSALON) 200  MG capsule    Sig: Take 1 capsule (200 mg total) by mouth 2 (two) times daily as needed for cough.    Dispense:  20 capsule    Refill:  1    Order Specific Question:   Supervising Provider    Answer:   Danise Edge A [[id number]]   azithromycin (ZITHROMAX) 250 MG tablet    Sig: Take 2 tablets on day 1, then 1 tablet daily on days 2 through 5    Dispense:  6 tablet    Refill:  0    Order Specific Question:   Supervising Provider    Answer:   Danise Edge A [[id number]]   methylPREDNISolone acetate (DEPO-MEDROL) injection 40 mg    Return if symptoms worsen or fail to improve.  Clayborne Dana, NP

## 2022-10-06 DIAGNOSIS — Z79899 Other long term (current) drug therapy: Secondary | ICD-10-CM | POA: Diagnosis not present

## 2022-10-06 DIAGNOSIS — M961 Postlaminectomy syndrome, not elsewhere classified: Secondary | ICD-10-CM | POA: Diagnosis not present

## 2022-10-06 DIAGNOSIS — G894 Chronic pain syndrome: Secondary | ICD-10-CM | POA: Diagnosis not present

## 2022-10-06 DIAGNOSIS — M7918 Myalgia, other site: Secondary | ICD-10-CM | POA: Diagnosis not present

## 2023-01-01 ENCOUNTER — Ambulatory Visit: Payer: BC Managed Care – PPO | Admitting: Adult Health

## 2023-01-06 DIAGNOSIS — M961 Postlaminectomy syndrome, not elsewhere classified: Secondary | ICD-10-CM | POA: Diagnosis not present

## 2023-01-06 DIAGNOSIS — G894 Chronic pain syndrome: Secondary | ICD-10-CM | POA: Diagnosis not present

## 2023-02-12 ENCOUNTER — Encounter: Payer: Self-pay | Admitting: Family Medicine

## 2023-02-12 ENCOUNTER — Other Ambulatory Visit (HOSPITAL_COMMUNITY)
Admission: RE | Admit: 2023-02-12 | Discharge: 2023-02-12 | Disposition: A | Discharge status: Home / Self Care | Payer: BC Managed Care – PPO | Source: Ambulatory Visit | Attending: Family Medicine | Admitting: Family Medicine

## 2023-02-12 ENCOUNTER — Ambulatory Visit (INDEPENDENT_AMBULATORY_CARE_PROVIDER_SITE_OTHER): Payer: BC Managed Care – PPO | Admitting: Family Medicine

## 2023-02-12 VITALS — BP 118/75 | HR 74 | Ht 63.0 in | Wt 172.0 lb

## 2023-02-12 DIAGNOSIS — Z0000 Encounter for general adult medical examination without abnormal findings: Secondary | ICD-10-CM

## 2023-02-12 DIAGNOSIS — Z0001 Encounter for general adult medical examination with abnormal findings: Secondary | ICD-10-CM | POA: Diagnosis not present

## 2023-02-12 DIAGNOSIS — N898 Other specified noninflammatory disorders of vagina: Secondary | ICD-10-CM

## 2023-02-12 DIAGNOSIS — E669 Obesity, unspecified: Secondary | ICD-10-CM

## 2023-02-12 DIAGNOSIS — B3731 Acute candidiasis of vulva and vagina: Secondary | ICD-10-CM | POA: Insufficient documentation

## 2023-02-12 DIAGNOSIS — E782 Mixed hyperlipidemia: Secondary | ICD-10-CM

## 2023-02-12 DIAGNOSIS — Z6830 Body mass index (BMI) 30.0-30.9, adult: Secondary | ICD-10-CM

## 2023-02-12 LAB — CBC WITH DIFFERENTIAL/PLATELET
Basophils Absolute: 0 10*3/uL (ref 0.0–0.1)
Basophils Relative: 0.3 % (ref 0.0–3.0)
Eosinophils Absolute: 0.2 10*3/uL (ref 0.0–0.7)
Eosinophils Relative: 2.9 % (ref 0.0–5.0)
HCT: 41.9 % (ref 36.0–46.0)
Hemoglobin: 14.3 g/dL (ref 12.0–15.0)
Lymphocytes Relative: 35.9 % (ref 12.0–46.0)
Lymphs Abs: 2.7 10*3/uL (ref 0.7–4.0)
MCHC: 34 g/dL (ref 30.0–36.0)
MCV: 87.1 fL (ref 78.0–100.0)
Monocytes Absolute: 0.6 10*3/uL (ref 0.1–1.0)
Monocytes Relative: 7.7 % (ref 3.0–12.0)
Neutro Abs: 3.9 10*3/uL (ref 1.4–7.7)
Neutrophils Relative %: 53.2 % (ref 43.0–77.0)
Platelets: 209 10*3/uL (ref 150.0–400.0)
RBC: 4.81 Mil/uL (ref 3.87–5.11)
RDW: 12.6 % (ref 11.5–15.5)
WBC: 7.4 10*3/uL (ref 4.0–10.5)

## 2023-02-12 LAB — COMPREHENSIVE METABOLIC PANEL
ALT: 10 U/L (ref 0–35)
AST: 14 U/L (ref 0–37)
Albumin: 4.7 g/dL (ref 3.5–5.2)
Alkaline Phosphatase: 42 U/L (ref 39–117)
BUN: 16 mg/dL (ref 6–23)
CO2: 30 meq/L (ref 19–32)
Calcium: 9.8 mg/dL (ref 8.4–10.5)
Chloride: 101 meq/L (ref 96–112)
Creatinine, Ser: 0.85 mg/dL (ref 0.40–1.20)
GFR: 83.85 mL/min (ref >60.00)
Glucose, Bld: 90 mg/dL (ref 70–99)
Potassium: 4.1 meq/L (ref 3.5–5.1)
Sodium: 138 meq/L (ref 135–145)
Total Bilirubin: 0.7 mg/dL (ref 0.2–1.2)
Total Protein: 7.1 g/dL (ref 6.0–8.3)

## 2023-02-12 LAB — LIPID PANEL
Cholesterol: 243 mg/dL — ABNORMAL HIGH (ref 0–200)
HDL: 42.7 mg/dL (ref >39.00)
LDL Cholesterol: 168 mg/dL — ABNORMAL HIGH (ref 0–99)
NonHDL: 200.18
Total CHOL/HDL Ratio: 6
Triglycerides: 160 mg/dL — ABNORMAL HIGH (ref 0.0–149.0)
VLDL: 32 mg/dL (ref 0.0–40.0)

## 2023-02-12 LAB — HEMOGLOBIN A1C: Hgb A1c MFr Bld: 5.6 % (ref 4.6–6.5)

## 2023-02-12 LAB — TSH: TSH: 1.85 u[IU]/mL (ref 0.35–5.50)

## 2023-02-12 NOTE — Patient Instructions (Signed)
Healthy Weight and Wellness with [facility]

## 2023-02-12 NOTE — Progress Notes (Signed)
Complete physical exam  Patient: Tanya Gallegos   DOB: 08-27-1980   42 y.o. Female  MRN: [id number]  Subjective:    Chief Complaint  Patient presents with   Annual Exam    Tanya Gallegos is a 42 y.o. female who presents today for a complete physical exam. She reports consuming a general diet. Home exercise routine includes walking. She generally feels fairly well. She reports sleeping fairly well. She does have additional problems to discuss today.   Currently lives with: alone Acute concerns or interim problems since last visit: no - feels like she is perimenopausal. No period in > 10 years (Mirena), she is having bloating after some foods.  -ongoing vaginal itching, occasional white discharge. No urinary symptoms, no pain. Not sexually active.  Vision concerns: no Dental concerns: no STD concerns: no  ETOH use: no Nicotine use: yes, daily Recreational drugs/illegal substances: no    Females:  She is not currently  sexually active  Contraception choices are: Mirena LMP: n/a      Most recent fall risk assessment:    02/12/2023    8:10 AM  Fall Risk   Falls in the past year? 0  Number falls in past yr: 0  Injury with Fall? 0  Risk for fall due to : No Fall Risks  Follow up Falls evaluation completed     Most recent depression screenings:    02/12/2023    8:10 AM 02/09/2022    8:49 AM  PHQ 2/9 Scores  PHQ - 2 Score 0 0            Patient Care Team: Clayborne Dana, NP as PCP - General (Family Medicine)   Outpatient Medications Prior to Visit  Medication Sig   buprenorphine (SUBUTEX) 2 MG SUBL SL tablet Place 2 mg under the tongue daily.   HYDROcodone-acetaminophen (NORCO) 10-325 MG tablet Take 1 tablet by mouth every 6 (six) hours as needed.   levonorgestrel (MIRENA) 20 MCG/DAY IUD by Intrauterine route.   pregabalin (LYRICA) 150 MG capsule Take 1 capsule by mouth 2 (two) times daily.   zolpidem (AMBIEN) 10 MG tablet Take 10 mg by mouth  at bedtime as needed for sleep.   [DISCONTINUED] benzonatate (TESSALON) 200 MG capsule Take 1 capsule (200 mg total) by mouth 2 (two) times daily as needed for cough.   No facility-administered medications prior to visit.    ROS All review of systems negative except what is listed in the HPI      Objective:     BP 118/75   Pulse 74   Ht 5\' 3"  (1.6 m)   Wt 172 lb (78 kg)   SpO2 97%   BMI 30.47 kg/m    Wt Readings from Last 3 Encounters:  02/12/23 172 lb (78 kg)  08/24/22 166 lb (75.3 kg)  07/02/22 167 lb 3.2 oz (75.8 kg)      Physical Exam Vitals reviewed.  Constitutional:      General: She is not in acute distress.    Appearance: Normal appearance. She is obese. She is not ill-appearing.  HENT:     Head: Normocephalic and atraumatic.     Right Ear: Tympanic membrane normal.     Left Ear: Tympanic membrane normal.     Nose: Nose normal.     Mouth/Throat:     Mouth: Mucous membranes are moist.     Pharynx: Oropharynx is clear.  Eyes:     Extraocular Movements: Extraocular movements intact.  Conjunctiva/sclera: Conjunctivae normal.     Pupils: Pupils are equal, round, and reactive to light.  Cardiovascular:     Rate and Rhythm: Normal rate and regular rhythm.     Pulses: Normal pulses.     Heart sounds: Normal heart sounds.  Pulmonary:     Effort: Pulmonary effort is normal.     Breath sounds: Normal breath sounds.  Abdominal:     General: Abdomen is flat. Bowel sounds are normal. There is no distension.     Palpations: Abdomen is soft. There is no mass.     Tenderness: There is no abdominal tenderness. There is no right CVA tenderness, left CVA tenderness, guarding or rebound.  Genitourinary:    Comments: Deferred exam Musculoskeletal:        General: Normal range of motion.     Cervical back: Normal range of motion and neck supple. No tenderness.     Right lower leg: No edema.     Left lower leg: No edema.  Lymphadenopathy:     Cervical: No  cervical adenopathy.  Skin:    General: Skin is warm and dry.     Capillary Refill: Capillary refill takes less than 2 seconds.  Neurological:     General: No focal deficit present.     Mental Status: She is alert and oriented to person, place, and time. Mental status is at baseline.  Psychiatric:        Mood and Affect: Mood normal.        Behavior: Behavior normal.        Thought Content: Thought content normal.        Judgment: Judgment normal.             No results found for any visits on 02/12/23.     Assessment & Plan:    Routine Health Maintenance and Physical Exam Discussed health promotion and safety including diet and exercise recommendations, dental health, and injury prevention. Tobacco cessation if applicable. Seat belts, sunscreen, smoke detectors, etc.    Immunization History  Administered Date(s) Administered   Influenza,inj,Quad PF,6+ Mos 01/10/2018   Influenza-Unspecified 01/06/2017   Tdap 06/22/2010    Health Maintenance  Topic Date Due   INFLUENZA VACCINE  06/21/2023 (Originally 10/22/2022)   DTaP/Tdap/Td (2 - Td or Tdap) 02/12/2024 (Originally 06/21/2020)   COVID-19 Vaccine (1) 02/12/2024 (Originally 08/01/1984)   Cervical Cancer Screening (HPV/Pap Cotest)  05/21/2025   HPV VACCINES  Aged Out   Hepatitis C Screening  Discontinued   HIV Screening  Discontinued        Problem List Items Addressed This Visit       Active Problems   Mixed hyperlipidemia   Relevant Orders   Lipid panel   Other Visit Diagnoses     Annual physical exam    -  Primary   Relevant Orders   CBC with Differential/Platelet   Comprehensive metabolic panel   Lipid panel   TSH   Hemoglobin A1c   Vaginal itching       Relevant Orders   Cervicovaginal ancillary only   Obesity (BMI 30-39.9)       Relevant Orders   Hemoglobin A1c      Return in about 1 year (around 02/12/2024) for physical.     Clayborne Dana, NP

## 2023-02-15 LAB — CERVICOVAGINAL ANCILLARY ONLY
Bacterial Vaginitis (gardnerella): NEGATIVE
Candida Glabrata: NEGATIVE
Candida Vaginitis: POSITIVE — AB
Comment: NEGATIVE
Comment: NEGATIVE
Comment: NEGATIVE

## 2023-02-15 MED ORDER — FLUCONAZOLE 150 MG PO TABS
150.0000 mg | ORAL_TABLET | Freq: Every day | ORAL | 0 refills | Status: DC
Start: 2023-02-15 — End: 2023-03-23

## 2023-02-15 NOTE — Progress Notes (Signed)
Cholesterol is high. Overall risk is low (see score below). For now, recommend lifestyle measures and adding Omega-3 supplement. Other labs are stable.   Lifestyle factors for lowering cholesterol include: Diet therapy - heart-healthy diet rich in fruits, veggies, fiber-rich whole grains, lean meats, chicken, fish (at least twice a week), fat-free or 1% dairy products; foods low in saturated/trans fats, cholesterol, sodium, and sugar. Mediterranean diet has shown to be very heart healthy. Regular exercise - recommend at least 30 minutes a day, 5 times per week Weight management    The 10-year ASCVD risk score (Arnett DK, et al., 2019) is: 4.8%   Values used to calculate the score:     Age: 41 years     Sex: Female     Is Non-Hispanic African American: No     Diabetic: No     Tobacco smoker: Yes     Systolic Blood Pressure: 118 mmHg     Is BP treated: No     HDL Cholesterol: 42.7 mg/dL     Total Cholesterol: 243 mg/dL

## 2023-02-15 NOTE — Addendum Note (Signed)
Addended by: Hyman Hopes B on: 02/15/2023 02:26 PM   Modules accepted: Orders

## 2023-03-23 ENCOUNTER — Encounter (INDEPENDENT_AMBULATORY_CARE_PROVIDER_SITE_OTHER): Payer: Self-pay | Admitting: Adult Health

## 2023-03-23 ENCOUNTER — Ambulatory Visit (INDEPENDENT_AMBULATORY_CARE_PROVIDER_SITE_OTHER): Payer: BC Managed Care – PPO | Admitting: Adult Health

## 2023-03-23 VITALS — BP 98/65 | HR 86 | Temp 98.0°F | Ht 63.0 in | Wt 166.0 lb

## 2023-03-23 DIAGNOSIS — E782 Mixed hyperlipidemia: Secondary | ICD-10-CM | POA: Diagnosis not present

## 2023-03-23 DIAGNOSIS — Z6829 Body mass index (BMI) 29.0-29.9, adult: Secondary | ICD-10-CM

## 2023-03-23 DIAGNOSIS — E669 Obesity, unspecified: Secondary | ICD-10-CM | POA: Diagnosis not present

## 2023-03-23 DIAGNOSIS — Z0000 Encounter for general adult medical examination without abnormal findings: Secondary | ICD-10-CM

## 2023-03-23 NOTE — Progress Notes (Signed)
 Office: [phone]  /  Fax: [phone]   Initial Visit  Tanya Gallegos was seen in clinic today to evaluate for obesity. She is interested in losing weight to improve overall health and reduce the risk of weight related complications. She presents today to review program treatment options, initial physical assessment, and evaluation.     She was referred by: PCP  When asked what else they would like to accomplish? She states: Adopt healthier eating patterns and Improve energy levels and physical activity  Weight history: She feels that her weight has essentially stable, however her clothing size has increased  When asked how has your weight affected you? She states: Contributed to medical problems, Having fatigue, Having poor endurance, and Problems with eating patterns  Some associated conditions: Hyperlipidemia  Contributing factors: Eating patterns  Weight promoting medications identified: Other: Narcotics  Current nutrition plan: None  Current level of physical activity: Walking  Current or previous pharmacotherapy: None  Response to medication: Never tried medications   Past medical history includes:   Past Medical History:  Diagnosis Date   Anxiety    Arthritis    Sleep apnea      Objective:   BP 98/65   Pulse 86   Temp 98 F (36.7 C)   Ht 5' 3 (1.6 m)   Wt 166 lb (75.3 kg)   SpO2 95%   BMI 29.41 kg/m  She was weighed on the bioimpedance scale: Body mass index is 29.41 kg/m.  Peak Weight:172 , Body Fat%:33.3, Visceral Fat Rating:7, Weight trend over the last 12 months: Increasing  General:  Alert, oriented and cooperative. Patient is in no acute distress.  Respiratory: Normal respiratory effort, no problems with respiration noted   Gait: able to ambulate independently  Mental Status: Normal mood and affect. Normal behavior. Normal judgment and thought content.   DIAGNOSTIC DATA REVIEWED:  BMET    Component Value Date/Time   NA 138  02/12/2023 0828   K 4.1 02/12/2023 0828   CL 101 02/12/2023 0828   CO2 30 02/12/2023 0828   GLUCOSE 90 02/12/2023 0828   BUN 16 02/12/2023 0828   CREATININE 0.85 02/12/2023 0828   CALCIUM 9.8 02/12/2023 0828   Lab Results  Component Value Date   HGBA1C 5.6 02/12/2023   No results found for: INSULIN CBC    Component Value Date/Time   WBC 7.4 02/12/2023 0828   RBC 4.81 02/12/2023 0828   HGB 14.3 02/12/2023 0828   HCT 41.9 02/12/2023 0828   PLT 209.0 02/12/2023 0828   MCV 87.1 02/12/2023 0828   MCHC 34.0 02/12/2023 0828   RDW 12.6 02/12/2023 0828   Iron/TIBC/Ferritin/ %Sat No results found for: IRON, TIBC, FERRITIN, IRONPCTSAT Lipid Panel     Component Value Date/Time   CHOL 243 (H) 02/12/2023 0828   TRIG 160.0 (H) 02/12/2023 0828   HDL 42.70 02/12/2023 0828   CHOLHDL 6 02/12/2023 0828   VLDL 32.0 02/12/2023 0828   LDLCALC 168 (H) 02/12/2023 0828   Hepatic Function Panel     Component Value Date/Time   PROT 7.1 02/12/2023 0828   ALBUMIN 4.7 02/12/2023 0828   AST 14 02/12/2023 0828   ALT 10 02/12/2023 0828   ALKPHOS 42 02/12/2023 0828   BILITOT 0.7 02/12/2023 0828      Component Value Date/Time   TSH 1.85 02/12/2023 0828     Assessment and Plan:   Healthcare maintenance  Mixed hyperlipidemia  Obesity (BMI 30-39.9), Starting BMI 29.41  EITHER ESTABLISH WITH [facility]  OR SCHEDULE NUTRITION APPT   Obesity Treatment / Action Plan:  Patient will work on garnering support from family and friends to begin weight loss journey. Will work on eliminating or reducing the presence of highly palatable, calorie dense foods in the home. Will complete provided nutritional and psychosocial assessment questionnaire before the next appointment. Will be scheduled for indirect calorimetry to determine resting energy expenditure in a fasting state.  This will allow us  to create a reduced calorie, high-protein meal plan to promote loss of fat mass while preserving muscle  mass. Counseled on the health benefits of losing 5%-15% of total body weight. Was counseled on nutritional approaches to weight loss and benefits of reducing processed foods and consuming plant-based foods and high quality protein as part of nutritional weight management. Was counseled on pharmacotherapy and role as an adjunct in weight management.   Obesity Education Performed Today:  She was weighed on the bioimpedance scale and results were discussed and documented in the synopsis.  We discussed obesity as a disease and the importance of a more detailed evaluation of all the factors contributing to the disease.  We discussed the importance of long term lifestyle changes which include nutrition, exercise and behavioral modifications as well as the importance of customizing this to her specific health and social needs.  We discussed the benefits of reaching a healthier weight to alleviate the symptoms of existing conditions and reduce the risks of the biomechanical, metabolic and psychological effects of obesity.  Rhona Scholler appears to be in the action stage of change and states they are ready to start intensive lifestyle modifications and behavioral modifications.  30 minutes was spent today on this visit including the above counseling, pre-visit chart review, and post-visit documentation.  Reviewed by clinician on day of visit: allergies, medications, problem list, medical history, surgical history, family history, social history, and previous encounter notes pertinent to obesity diagnosis.   Makayli Bracken d. Jeweldean Drohan, NP-C

## 2023-04-08 DIAGNOSIS — M961 Postlaminectomy syndrome, not elsewhere classified: Secondary | ICD-10-CM | POA: Diagnosis not present

## 2023-04-08 DIAGNOSIS — G894 Chronic pain syndrome: Secondary | ICD-10-CM | POA: Diagnosis not present

## 2023-04-08 DIAGNOSIS — M7918 Myalgia, other site: Secondary | ICD-10-CM | POA: Diagnosis not present

## 2023-04-08 DIAGNOSIS — Z79899 Other long term (current) drug therapy: Secondary | ICD-10-CM | POA: Diagnosis not present

## 2023-04-12 ENCOUNTER — Encounter (INDEPENDENT_AMBULATORY_CARE_PROVIDER_SITE_OTHER): Payer: Self-pay | Admitting: Adult Health

## 2023-04-14 ENCOUNTER — Encounter: Payer: Self-pay | Admitting: Family Medicine

## 2023-04-14 ENCOUNTER — Ambulatory Visit (HOSPITAL_BASED_OUTPATIENT_CLINIC_OR_DEPARTMENT_OTHER)
Admission: RE | Admit: 2023-04-14 | Discharge: 2023-04-14 | Disposition: A | Discharge status: Home / Self Care | Payer: BC Managed Care – PPO | Source: Ambulatory Visit | Attending: Family Medicine | Admitting: Family Medicine

## 2023-04-14 ENCOUNTER — Ambulatory Visit: Payer: BC Managed Care – PPO | Admitting: Family Medicine

## 2023-04-14 VITALS — BP 110/71 | HR 115 | Temp 100.9°F | Ht 63.0 in | Wt 171.0 lb

## 2023-04-14 DIAGNOSIS — J988 Other specified respiratory disorders: Secondary | ICD-10-CM | POA: Diagnosis not present

## 2023-04-14 DIAGNOSIS — R059 Cough, unspecified: Secondary | ICD-10-CM | POA: Diagnosis not present

## 2023-04-14 DIAGNOSIS — R5383 Other fatigue: Secondary | ICD-10-CM | POA: Diagnosis not present

## 2023-04-14 DIAGNOSIS — R509 Fever, unspecified: Secondary | ICD-10-CM | POA: Diagnosis not present

## 2023-04-14 DIAGNOSIS — J9811 Atelectasis: Secondary | ICD-10-CM | POA: Diagnosis not present

## 2023-04-14 MED ORDER — PREDNISONE 20 MG PO TABS: 20.0000 mg | ORAL_TABLET | Freq: Every day | ORAL | 0 refills | Status: DC

## 2023-04-14 MED ORDER — BENZONATATE 200 MG PO CAPS: 200.0000 mg | ORAL_CAPSULE | Freq: Two times a day (BID) | ORAL | 0 refills | Status: DC | PRN

## 2023-04-14 MED ORDER — LEVALBUTEROL TARTRATE 45 MCG/ACT IN AERO: 1.0000 | INHALATION_SPRAY | RESPIRATORY_TRACT | 12 refills | Status: DC | PRN

## 2023-04-14 MED ORDER — GUAIFENESIN ER 600 MG PO TB12: 1200.0000 mg | ORAL_TABLET | Freq: Two times a day (BID) | ORAL | 0 refills | Status: DC

## 2023-04-14 MED ORDER — AZITHROMYCIN 250 MG PO TABS: ORAL_TABLET | ORAL | 0 refills | Status: AC

## 2023-04-14 NOTE — Progress Notes (Signed)
Acute Office Visit  Subjective:     Patient ID: Tanya Gallegos, female    DOB: 1980-07-23, 43 y.o.   MRN: [id number]  CC: cough   HPI Patient is in today for cough, fever.    Discussed the use of AI scribe software for clinical note transcription with the patient, who gave verbal consent to proceed.  History of Present Illness   The patient, with a history of recurrent bronchitis, presented with symptoms that began subtly 3-4 days ago with a tickling sore throat. By the next day, they noticed swollen glands and by the following morning, they experienced a rapid onset of symptoms, feeling as if the illness had "settled in." The patient reported feeling feverish starting from the previous afternoon, which they attempted to manage with ibuprofen. Despite a brief period of relief, the fever returned during the night, particularly when lying down, which also seemed to exacerbate the chest discomfort.  The patient denied nasal congestion but reported clogged ears and pressure upon burping. They also experienced body aches associated with the fever. The patient did not check their temperature but reported feeling feverish. They also reported wheezing, which was constant once the illness had "settled in the chest," until they were able to cough up yellow phlegm. The patient had an albuterol inhaler from a year or two ago, which they were unsure of its remaining doses or expiration.  The patient reported productive cough with yellow, thick mucus and shortness of breath with regular activities. They denied any blood in the sputum. The patient also reported swollen glands and a history of getting similar symptoms annually, typically following a pattern of warm weather followed by heavy rain and fog. They denied any nausea, vomiting, or diarrhea. The patient was attending school and working during the day, which they acknowledged might have contributed to overworking themselves.        ROS All  review of systems negative except what is listed in the HPI      Objective:    BP 110/71   Pulse (!) 115   Temp (!) 100.9 F (38.3 C) (Oral)   Ht 5\' 3"  (1.6 m)   Wt 171 lb (77.6 kg)   SpO2 93%   BMI 30.29 kg/m    Physical Exam Vitals reviewed.  Constitutional:      General: She is not in acute distress.    Appearance: Normal appearance. She is not ill-appearing.  HENT:     Head: Normocephalic and atraumatic.     Right Ear: Tympanic membrane normal.     Left Ear: There is impacted cerumen.     Nose: No congestion or rhinorrhea.     Mouth/Throat:     Pharynx: Oropharynx is clear. No oropharyngeal exudate or posterior oropharyngeal erythema.  Cardiovascular:     Rate and Rhythm: Normal rate and regular rhythm.  Pulmonary:     Effort: Pulmonary effort is normal. No respiratory distress.     Breath sounds: Normal breath sounds. No rhonchi or rales.     Comments: Occasional faint wheeze Lymphadenopathy:     Cervical: Cervical adenopathy present.  Skin:    General: Skin is warm and dry.  Neurological:     Mental Status: She is alert and oriented to person, place, and time.  Psychiatric:        Mood and Affect: Mood normal.        Behavior: Behavior normal.        Thought Content: Thought content normal.  Judgment: Judgment normal.        No results found for any visits on 04/14/23.      Assessment & Plan:   Problem List Items Addressed This Visit   None Visit Diagnoses       Respiratory infection    -  Primary   Relevant Medications   benzonatate (TESSALON) 200 MG capsule   guaiFENesin (MUCINEX) 600 MG 12 hr tablet   levalbuterol (XOPENEX HFA) 45 MCG/ACT inhaler   azithromycin (ZITHROMAX) 250 MG tablet   Other Relevant Orders   DG Chest 2 View         Fever, cough with yellow sputum, wheezing, and body aches. Symptoms started Sunday night. Patient refused COVID-19 testing. Tachycardia present, possibly due to fever. Oxygen saturation at 93%.   -Order chest X-ray given mild dyspnea, tachycardia, and mildly low O2 levels -Prescribe levalbuterol (Xopenex) inhaler if covered by insurance. -Prescribe benzonatate for cough. -Advise use of Mucinex to help break up mucus. -Advise use of humidifier or steamy bathroom to help open airways. -Advise patient to monitor heart rate and oxygen saturation at home. -Advised against steroid use right now as she appears stable and heart rate is consistently elevated during visit. -If oxygen saturation consistently below 92%, advise patient to seek hospital care for oxygen therapy. -Hold "watch-and-wait" Z-Pak unless chest X-ray shows possible pneumonia or fever does not resolve in two days, then start. -Patient aware of supportive measures and signs/symptoms to monitor for.       Meds ordered this encounter  Medications   benzonatate (TESSALON) 200 MG capsule    Sig: Take 1 capsule (200 mg total) by mouth 2 (two) times daily as needed for cough.    Dispense:  20 capsule    Refill:  0    Supervising Provider:   Danise Edge A [[id number]]   guaiFENesin (MUCINEX) 600 MG 12 hr tablet    Sig: Take 2 tablets (1,200 mg total) by mouth 2 (two) times daily.    Dispense:  30 tablet    Refill:  0    Supervising Provider:   Danise Edge A [[id number]]   levalbuterol (XOPENEX HFA) 45 MCG/ACT inhaler    Sig: Inhale 1-2 puffs into the lungs every 4 (four) hours as needed for wheezing.    Dispense:  1 each    Refill:  12    Supervising Provider:   Danise Edge A [[id number]]   azithromycin (ZITHROMAX) 250 MG tablet    Sig: Take 2 tablets on day 1, then 1 tablet daily on days 2 through 5    Dispense:  6 tablet    Refill:  0    Supervising Provider:   Danise Edge A [[id number]]    Return if symptoms worsen or fail to improve.  Clayborne Dana, NP

## 2023-04-15 MED ORDER — PREDNISONE 20 MG PO TABS: 20.0000 mg | ORAL_TABLET | Freq: Every day | ORAL | 0 refills | Status: AC

## 2023-04-15 NOTE — Addendum Note (Signed)
Addended bySilvio Pate on: 04/15/2023 09:05 AM   Modules accepted: Orders

## 2023-04-20 ENCOUNTER — Encounter: Payer: Self-pay | Admitting: Family Medicine

## 2023-04-20 DIAGNOSIS — N898 Other specified noninflammatory disorders of vagina: Secondary | ICD-10-CM

## 2023-04-20 MED ORDER — FLUCONAZOLE 150 MG PO TABS: 150.0000 mg | ORAL_TABLET | Freq: Every day | ORAL | 0 refills | Status: DC

## 2023-06-21 DIAGNOSIS — M7918 Myalgia, other site: Secondary | ICD-10-CM | POA: Diagnosis not present

## 2023-06-21 DIAGNOSIS — M961 Postlaminectomy syndrome, not elsewhere classified: Secondary | ICD-10-CM | POA: Diagnosis not present

## 2023-06-21 DIAGNOSIS — G894 Chronic pain syndrome: Secondary | ICD-10-CM | POA: Diagnosis not present

## 2023-09-27 DIAGNOSIS — Z79899 Other long term (current) drug therapy: Secondary | ICD-10-CM | POA: Diagnosis not present

## 2023-09-27 DIAGNOSIS — G894 Chronic pain syndrome: Secondary | ICD-10-CM | POA: Diagnosis not present

## 2023-09-27 DIAGNOSIS — M7918 Myalgia, other site: Secondary | ICD-10-CM | POA: Diagnosis not present

## 2023-09-27 DIAGNOSIS — M961 Postlaminectomy syndrome, not elsewhere classified: Secondary | ICD-10-CM | POA: Diagnosis not present

## 2023-12-28 DIAGNOSIS — G894 Chronic pain syndrome: Secondary | ICD-10-CM | POA: Diagnosis not present

## 2023-12-28 DIAGNOSIS — M961 Postlaminectomy syndrome, not elsewhere classified: Secondary | ICD-10-CM | POA: Diagnosis not present

## 2024-02-22 ENCOUNTER — Ambulatory Visit: Payer: BC Managed Care – PPO | Admitting: Family Medicine

## 2024-02-22 ENCOUNTER — Encounter: Payer: Self-pay | Admitting: Family Medicine

## 2024-02-22 VITALS — BP 125/88 | HR 82 | Ht 63.0 in | Wt 178.0 lb

## 2024-02-22 DIAGNOSIS — E782 Mixed hyperlipidemia: Secondary | ICD-10-CM | POA: Diagnosis not present

## 2024-02-22 DIAGNOSIS — Z0000 Encounter for general adult medical examination without abnormal findings: Secondary | ICD-10-CM | POA: Diagnosis not present

## 2024-02-22 LAB — CBC WITH DIFFERENTIAL/PLATELET
Basophils Absolute: 0 K/uL (ref 0.0–0.1)
Basophils Relative: 0.4 % (ref 0.0–3.0)
Eosinophils Absolute: 0.2 K/uL (ref 0.0–0.7)
Eosinophils Relative: 3.1 % (ref 0.0–5.0)
HCT: 39.4 % (ref 36.0–46.0)
Hemoglobin: 13.8 g/dL (ref 12.0–15.0)
Lymphocytes Relative: 35.5 % (ref 12.0–46.0)
Lymphs Abs: 2.6 K/uL (ref 0.7–4.0)
MCHC: 34.9 g/dL (ref 30.0–36.0)
MCV: 83.7 fl (ref 78.0–100.0)
Monocytes Absolute: 0.5 K/uL (ref 0.1–1.0)
Monocytes Relative: 7.2 % (ref 3.0–12.0)
Neutro Abs: 3.9 K/uL (ref 1.4–7.7)
Neutrophils Relative %: 53.8 % (ref 43.0–77.0)
Platelets: 209 K/uL (ref 150.0–400.0)
RBC: 4.71 Mil/uL (ref 3.87–5.11)
RDW: 12.7 % (ref 11.5–15.5)
WBC: 7.2 K/uL (ref 4.0–10.5)

## 2024-02-22 LAB — COMPREHENSIVE METABOLIC PANEL WITH GFR
ALT: 11 U/L (ref 0–35)
AST: 13 U/L (ref 0–37)
Albumin: 4.7 g/dL (ref 3.5–5.2)
Alkaline Phosphatase: 46 U/L (ref 39–117)
BUN: 15 mg/dL (ref 6–23)
CO2: 29 meq/L (ref 19–32)
Calcium: 9.6 mg/dL (ref 8.4–10.5)
Chloride: 103 meq/L (ref 96–112)
Creatinine, Ser: 0.75 mg/dL (ref 0.40–1.20)
GFR: 96.73 mL/min (ref >60.00)
Glucose, Bld: 93 mg/dL (ref 70–99)
Potassium: 4.2 meq/L (ref 3.5–5.1)
Sodium: 138 meq/L (ref 135–145)
Total Bilirubin: 0.4 mg/dL (ref 0.2–1.2)
Total Protein: 7 g/dL (ref 6.0–8.3)

## 2024-02-22 LAB — LIPID PANEL
Cholesterol: 281 mg/dL — ABNORMAL HIGH (ref 0–200)
HDL: 48.4 mg/dL (ref >39.00)
LDL Cholesterol: 197 mg/dL — ABNORMAL HIGH (ref 0–99)
NonHDL: 232.66
Total CHOL/HDL Ratio: 6
Triglycerides: 180 mg/dL — ABNORMAL HIGH (ref 0.0–149.0)
VLDL: 36 mg/dL (ref 0.0–40.0)

## 2024-02-22 LAB — TSH: TSH: 1.49 u[IU]/mL (ref 0.35–5.50)

## 2024-02-22 NOTE — Progress Notes (Signed)
 Complete physical exam  Patient: Tanya Gallegos   DOB: 11/07/1980   43 y.o. Female  MRN: [id number]  Subjective:    Chief Complaint  Patient presents with   Annual Exam    Tanya Gallegos is a 43 y.o. female who presents today for a complete physical exam. She reports consuming a general diet. The patient does not participate in regular exercise at present. She generally feels well. She reports sleeping well. She does not have additional problems to discuss today.   Acute concerns or interim problems since last visit: no  Vision concerns: no Dental concerns: no  ETOH use: no Nicotine use: yes Recreational drugs/illegal substances: no  Females:  She is not currently  sexually active  Contraception choices are: IUD LMP: No LMP recorded. (Menstrual status: IUD).  Most recent fall risk assessment:    02/22/2024    8:59 AM  Fall Risk   Falls in the past year? 0  Number falls in past yr: 0  Injury with Fall? 0  Risk for fall due to : No Fall Risks  Follow up Falls evaluation completed     Most recent depression screenings:    02/22/2024    8:59 AM 02/12/2023    8:10 AM  PHQ 2/9 Scores  PHQ - 2 Score 0 0  PHQ- 9 Score 1             Patient Care Team: Almarie Waddell NOVAK, NP as PCP - General (Family Medicine)   Outpatient Medications Prior to Visit  Medication Sig   buprenorphine  (SUBUTEX ) 2 MG SUBL SL tablet Place 2 mg under the tongue daily.   HYDROcodone-acetaminophen (NORCO) 10-325 MG tablet Take 1 tablet by mouth every 6 (six) hours as needed.   levonorgestrel (MIRENA) 20 MCG/DAY IUD by Intrauterine route.   pregabalin (LYRICA) 150 MG capsule Take 1 capsule by mouth 2 (two) times daily.   zolpidem (AMBIEN) 10 MG tablet Take 10 mg by mouth at bedtime as needed for sleep.   [DISCONTINUED] benzonatate  (TESSALON ) 200 MG capsule Take 1 capsule (200 mg total) by mouth 2 (two) times daily as needed for cough.   [DISCONTINUED] fluconazole  (DIFLUCAN ) 150 MG  tablet Take 1 tablet (150 mg total) by mouth daily. May repeat in 3 days if needed.   [DISCONTINUED] guaiFENesin  (MUCINEX ) 600 MG 12 hr tablet Take 2 tablets (1,200 mg total) by mouth 2 (two) times daily.   [DISCONTINUED] levalbuterol  (XOPENEX  HFA) 45 MCG/ACT inhaler Inhale 1-2 puffs into the lungs every 4 (four) hours as needed for wheezing.   No facility-administered medications prior to visit.    ROS All review of systems negative except what is listed in the HPI        Objective:     BP 125/88   Pulse 82   Ht 5' 3 (1.6 m)   Wt 178 lb (80.7 kg)   SpO2 98%   BMI 31.53 kg/m    Physical Exam Vitals reviewed.  Constitutional:      General: She is not in acute distress.    Appearance: Normal appearance. She is obese. She is not ill-appearing.  HENT:     Head: Normocephalic and atraumatic.     Right Ear: Tympanic membrane normal.     Left Ear: Tympanic membrane normal.     Nose: Nose normal.     Mouth/Throat:     Mouth: Mucous membranes are moist.     Pharynx: Oropharynx is clear.  Eyes:     Extraocular  Movements: Extraocular movements intact.     Conjunctiva/sclera: Conjunctivae normal.     Pupils: Pupils are equal, round, and reactive to light.  Cardiovascular:     Rate and Rhythm: Normal rate and regular rhythm.     Pulses: Normal pulses.     Heart sounds: Normal heart sounds.  Pulmonary:     Effort: Pulmonary effort is normal.     Breath sounds: Normal breath sounds.  Abdominal:     General: Abdomen is flat. Bowel sounds are normal. There is no distension.     Palpations: Abdomen is soft. There is no mass.     Tenderness: There is no abdominal tenderness. There is no right CVA tenderness, left CVA tenderness, guarding or rebound.  Genitourinary:    Comments: Deferred exam Musculoskeletal:        General: Normal range of motion.     Cervical back: Normal range of motion and neck supple. No tenderness.     Right lower leg: No edema.     Left lower leg: No  edema.  Lymphadenopathy:     Cervical: No cervical adenopathy.  Skin:    General: Skin is warm and dry.     Capillary Refill: Capillary refill takes less than 2 seconds.  Neurological:     General: No focal deficit present.     Mental Status: She is alert and oriented to person, place, and time. Mental status is at baseline.  Psychiatric:        Mood and Affect: Mood normal.        Behavior: Behavior normal.        Thought Content: Thought content normal.        Judgment: Judgment normal.        No results found for any visits on 02/22/24.     Assessment & Plan:    Routine Health Maintenance and Physical Exam Discussed health promotion and safety including diet and exercise recommendations, dental health, and injury prevention. Tobacco cessation if applicable. Seat belts, sunscreen, smoke detectors, etc.    Immunization History  Administered Date(s) Administered   Influenza,inj,Quad PF,6+ Mos 01/10/2018   Influenza-Unspecified 01/06/2017   Tdap 06/22/2010    Health Maintenance  Topic Date Due   Pneumococcal Vaccine (1 of 2 - PCV) 04/13/2024 (Originally 08/02/1998)   Influenza Vaccine  06/20/2024 (Originally 10/22/2023)   HPV VACCINES (1 - 3-dose SCDM series) 02/20/2025 (Originally 08/02/2006)   COVID-19 Vaccine (1) 02/20/2025 (Originally 08/01/1984)   DTaP/Tdap/Td (2 - Td or Tdap) 02/21/2025 (Originally 06/21/2020)   Hepatitis B Vaccines 19-59 Average Risk (1 of 3 - 19+ 3-dose series) 02/21/2025 (Originally 08/02/1998)   Mammogram  02/21/2025 (Originally 05/22/2022)   Cervical Cancer Screening (HPV/Pap Cotest)  05/21/2025   Meningococcal B Vaccine  Aged Out   Hepatitis C Screening  Discontinued   HIV Screening  Discontinued        Problem List Items Addressed This Visit       Active Problems   Mixed hyperlipidemia   Relevant Orders   Lipid panel   Other Visit Diagnoses       Annual physical exam    -  Primary   Relevant Orders   CBC with Differential/Platelet    Comprehensive metabolic panel with GFR   Lipid panel   TSH          PATIENT COUNSELING:   Advised to take 1 mg of folate supplement per day if capable of pregnancy.   Encouraged smoking cessation.   Recommend that  most people either abstain from alcohol or drink within safe limits (<=14/week and <=4 drinks/occasion for males, <=7/weeks and <= 3 drinks/occasion for females) and that the risk for alcohol disorders and other health effects rises proportionally with the number of drinks per week and how often a drinker exceeds daily limits.   Diet: Recommend to adjust caloric intake to maintain or achieve ideal body weight, to reduce intake of dietary saturated fat and total fat, to limit sodium intake by avoiding high sodium foods and not adding table salt, and to maintain adequate dietary potassium and calcium preferably from fresh fruits, vegetables, and low-fat dairy products.   Emphasized the importance of regular exercise.  Injury prevention: Recommend seatbelts, safety helmets, smoke detector, etc..   Dental health: Recommend regular tooth brushing, flossing, and dental visits.       Return in about 1 year (around 02/21/2025) for physical.     Waddell KATHEE Mon, NP  I,Emily Lagle,acting as a scribe for Waddell KATHEE Mon, NP.,have documented all relevant documentation on the behalf of Waddell KATHEE Mon, NP.  I, Waddell KATHEE Mon, NP, have reviewed all documentation for this visit. The documentation on 02/22/2024 for the exam, diagnosis, procedures, and orders are all accurate and complete.

## 2024-02-24 ENCOUNTER — Ambulatory Visit: Payer: Self-pay | Admitting: Family Medicine

## 2024-02-24 DIAGNOSIS — E782 Mixed hyperlipidemia: Secondary | ICD-10-CM

## 2024-02-24 MED ORDER — ROSUVASTATIN CALCIUM 20 MG PO TABS: 20.0000 mg | ORAL_TABLET | Freq: Every day | ORAL | 1 refills | Status: DC

## 2024-04-05 ENCOUNTER — Encounter: Payer: Self-pay | Admitting: Family Medicine

## 2024-04-05 ENCOUNTER — Ambulatory Visit: Payer: Self-pay

## 2024-04-05 DIAGNOSIS — G894 Chronic pain syndrome: Secondary | ICD-10-CM

## 2024-04-05 DIAGNOSIS — E782 Mixed hyperlipidemia: Secondary | ICD-10-CM

## 2024-04-05 DIAGNOSIS — M797 Fibromyalgia: Secondary | ICD-10-CM

## 2024-04-05 MED ORDER — PRAVASTATIN SODIUM 40 MG PO TABS: 40.0000 mg | ORAL_TABLET | Freq: Every day | ORAL | 1 refills | Status: AC

## 2024-04-05 NOTE — Telephone Encounter (Signed)
 Patient hung up prior to being transferred to this RN for triage. 1st attempt at calling patient back--left a voicemail                     Copied from CRM [id number]. Topic: Clinical - Red Word Triage >> Apr 05, 2024  9:34 AM Avram MATSU wrote: Red Word that prompted transfer to Nurse Triage: joint pain all over, having knots on shoulder. Started when patient started taking rosuvastatin   20 MG tablet

## 2024-04-05 NOTE — Telephone Encounter (Signed)
 Patient sent a mychart message about issue.

## 2024-04-05 NOTE — Telephone Encounter (Signed)
 FYI Only or Action Required?: Action required by provider: clinical question for provider.requesting Requesting alternative medication for cholesterol    Patient was last seen in primary care on 02/22/2024 by Almarie Waddell NOVAK, NP.  Called Nurse Triage reporting Medication Reaction.  Symptoms began present when taking rosuvastatin  .  Interventions attempted: Other: stopped the rosuvastatin  .  Symptoms are: stable.  Triage Disposition: Discuss With PCP and Callback by Nurse Today (overriding Call PCP When Office is Open)  Patient/caregiver understands and will follow disposition?: Yes           Reason for Disposition  [1] Caller has NON-URGENT medicine question about med that PCP prescribed AND [2] triager unable to answer question  Answer Assessment - Initial Assessment Questions Outbound call placed to patient . Spoke with patient who reports also sent email regarding symptoms. Stopped statin rosuvastatin  (CRESTOR ) 20 MG tablet  about    2 -3 weeks ago.  saw pain doctor today , was told needs to be on something   for cholesterol  . This medication was stopped 2 weeks ago within 24 hours no muscle or joint pain, has chronic pain and the medication flared it up. Also told by pain provider should have the vitamin D checked and take fish oil to reduce triglycerides too wants to know what the PCP thinks. Aunt is prescribed vitamin D takes this weekly. Wants to know if should be taking.  Requesting new medication for cholesterol  to [facility] always wondering if needs move lab appointment for cholesterol  to later.   1. NAME of MEDICINE: What medicine(s) are you calling about?     rosuvastatin  (CRESTOR ) 20 MG tablet  2. QUESTION: What is your question? (e.g., double dose of medicine, side effect)     Side effect concern joint pain muscle pains which stopped after stopping the medication. Also when taking medication reporting it flared her fibromyalgia sees chronic pain doctor   3.  PRESCRIBER: Who prescribed the medicine? Reason: if prescribed by specialist, call should be referred to that group.     PCP  4. SYMPTOMS: Do you have any symptoms? If Yes, ask: What symptoms are you having?  How bad are the symptoms (e.g., mild, moderate, severe)     No symptoms at this time only when taking it  Protocols used: Medication Question Call-A-AH

## 2024-04-25 NOTE — Addendum Note (Signed)
 Addended by: Sarahanne Novakowski L on: 04/25/2024 11:13 AM   Modules accepted: Orders

## 2024-05-26 ENCOUNTER — Other Ambulatory Visit

## 2025-02-23 ENCOUNTER — Encounter: Admitting: Family Medicine
